# Patient Record
Sex: Male | Born: 1985 | Race: White | Hispanic: No | Marital: Single | Smoking: Current every day smoker
Health system: Southern US, Community
[De-identification: ages and names within clinical notes are randomized; demographics above are authoritative.]

## PROBLEM LIST (undated history)

## (undated) DIAGNOSIS — B1920 Unspecified viral hepatitis C without hepatic coma: Secondary | ICD-10-CM

## (undated) DIAGNOSIS — F1910 Other psychoactive substance abuse, uncomplicated: Secondary | ICD-10-CM

---

## 2006-03-18 ENCOUNTER — Emergency Department: Payer: Self-pay | Admitting: Emergency Medicine

## 2008-07-13 ENCOUNTER — Emergency Department: Payer: Self-pay | Admitting: Emergency Medicine

## 2009-12-02 ENCOUNTER — Emergency Department: Payer: Self-pay | Admitting: Unknown Physician Specialty

## 2010-02-21 ENCOUNTER — Emergency Department: Payer: Self-pay | Admitting: Unknown Physician Specialty

## 2010-05-05 ENCOUNTER — Emergency Department: Payer: Self-pay | Admitting: Emergency Medicine

## 2010-05-12 ENCOUNTER — Emergency Department: Payer: Self-pay | Admitting: Emergency Medicine

## 2010-05-18 ENCOUNTER — Emergency Department: Payer: Self-pay | Admitting: Internal Medicine

## 2010-07-31 ENCOUNTER — Emergency Department: Payer: Self-pay | Admitting: Unknown Physician Specialty

## 2010-08-03 ENCOUNTER — Emergency Department: Payer: Self-pay | Admitting: Emergency Medicine

## 2011-03-12 ENCOUNTER — Emergency Department: Payer: Self-pay | Admitting: Emergency Medicine

## 2011-03-21 ENCOUNTER — Emergency Department: Payer: Self-pay | Admitting: Emergency Medicine

## 2011-04-17 ENCOUNTER — Emergency Department: Payer: Self-pay | Admitting: Internal Medicine

## 2011-05-27 ENCOUNTER — Emergency Department: Payer: Self-pay | Admitting: Unknown Physician Specialty

## 2011-09-06 ENCOUNTER — Emergency Department: Payer: Self-pay | Admitting: Emergency Medicine

## 2011-12-06 ENCOUNTER — Emergency Department: Payer: Self-pay | Admitting: Emergency Medicine

## 2011-12-06 LAB — URINALYSIS, COMPLETE
Bacteria: NONE SEEN
Glucose,UR: NEGATIVE mg/dL (ref 0–75)
Nitrite: NEGATIVE
Ph: 7 (ref 4.5–8.0)
Protein: NEGATIVE
Specific Gravity: 1.001 (ref 1.003–1.030)
WBC UR: <1 /HPF (ref 0–5)

## 2011-12-06 LAB — CBC
HGB: 15.3 g/dL (ref 13.0–18.0)
MCH: 34.1 pg — ABNORMAL HIGH (ref 26.0–34.0)
MCHC: 34.6 g/dL (ref 32.0–36.0)
MCV: 99 fL (ref 80–100)
RBC: 4.49 10*6/uL (ref 4.40–5.90)
WBC: 6.1 10*3/uL (ref 3.8–10.6)

## 2011-12-06 LAB — DRUG SCREEN, URINE
Amphetamines, Ur Screen: NEGATIVE (ref ?–1000)
Barbiturates, Ur Screen: NEGATIVE (ref ?–200)
Cannabinoid 50 Ng, Ur ~~LOC~~: NEGATIVE (ref ?–50)
Methadone, Ur Screen: NEGATIVE (ref ?–300)
Tricyclic, Ur Screen: NEGATIVE (ref ?–1000)

## 2011-12-06 LAB — COMPREHENSIVE METABOLIC PANEL
Anion Gap: 11 (ref 7–16)
Bilirubin,Total: 0.6 mg/dL (ref 0.2–1.0)
Chloride: 108 mmol/L — ABNORMAL HIGH (ref 98–107)
Co2: 25 mmol/L (ref 21–32)
Creatinine: 0.78 mg/dL (ref 0.60–1.30)
EGFR (African American): >60
EGFR (Non-African Amer.): >60
Potassium: 3.5 mmol/L (ref 3.5–5.1)
SGOT(AST): 41 U/L — ABNORMAL HIGH (ref 15–37)
Sodium: 144 mmol/L (ref 136–145)

## 2011-12-06 LAB — TSH: Thyroid Stimulating Horm: 0.63 u[IU]/mL

## 2014-02-14 ENCOUNTER — Emergency Department: Payer: Self-pay | Admitting: Emergency Medicine

## 2014-02-14 LAB — DRUG SCREEN, URINE
AMPHETAMINES, UR SCREEN: POSITIVE (ref ?–1000)
Barbiturates, Ur Screen: NEGATIVE (ref ?–200)
Benzodiazepine, Ur Scrn: POSITIVE (ref ?–200)
CANNABINOID 50 NG, UR ~~LOC~~: POSITIVE (ref ?–50)
COCAINE METABOLITE, UR ~~LOC~~: POSITIVE (ref ?–300)
MDMA (Ecstasy)Ur Screen: NEGATIVE (ref ?–500)
METHADONE, UR SCREEN: NEGATIVE (ref ?–300)
OPIATE, UR SCREEN: NEGATIVE (ref ?–300)
Phencyclidine (PCP) Ur S: NEGATIVE (ref ?–25)
TRICYCLIC, UR SCREEN: NEGATIVE (ref ?–1000)

## 2014-02-14 LAB — COMPREHENSIVE METABOLIC PANEL
ALBUMIN: 3.8 g/dL (ref 3.4–5.0)
Alkaline Phosphatase: 93 U/L
Anion Gap: 9 (ref 7–16)
BUN: 9 mg/dL (ref 7–18)
Bilirubin,Total: 0.5 mg/dL (ref 0.2–1.0)
Calcium, Total: 8.6 mg/dL (ref 8.5–10.1)
Chloride: 108 mmol/L — ABNORMAL HIGH (ref 98–107)
Co2: 23 mmol/L (ref 21–32)
Creatinine: 0.78 mg/dL (ref 0.60–1.30)
EGFR (African American): >60
Glucose: 101 mg/dL — ABNORMAL HIGH (ref 65–99)
OSMOLALITY: 278 (ref 275–301)
Potassium: 3.7 mmol/L (ref 3.5–5.1)
SGOT(AST): 34 U/L (ref 15–37)
SGPT (ALT): 29 U/L (ref 12–78)
SODIUM: 140 mmol/L (ref 136–145)
Total Protein: 8.2 g/dL (ref 6.4–8.2)

## 2014-02-14 LAB — CBC
HCT: 46.4 % (ref 40.0–52.0)
HGB: 16.2 g/dL (ref 13.0–18.0)
MCH: 34.5 pg — ABNORMAL HIGH (ref 26.0–34.0)
MCHC: 34.9 g/dL (ref 32.0–36.0)
MCV: 99 fL (ref 80–100)
PLATELETS: 240 10*3/uL (ref 150–440)
RBC: 4.69 10*6/uL (ref 4.40–5.90)
RDW: 12.5 % (ref 11.5–14.5)
WBC: 10.1 10*3/uL (ref 3.8–10.6)

## 2014-02-14 LAB — SALICYLATE LEVEL: Salicylates, Serum: 3.3 mg/dL — ABNORMAL HIGH

## 2014-02-14 LAB — ETHANOL
ETHANOL LVL: 177 mg/dL
Ethanol %: 0.177 % — ABNORMAL HIGH (ref 0.000–0.080)

## 2014-02-14 LAB — ACETAMINOPHEN LEVEL

## 2014-02-14 LAB — TSH: Thyroid Stimulating Horm: 0.518 u[IU]/mL

## 2014-11-01 ENCOUNTER — Emergency Department: Payer: Self-pay | Admitting: Emergency Medicine

## 2014-11-01 LAB — DRUG SCREEN, URINE
Amphetamines, Ur Screen: POSITIVE (ref ?–1000)
BENZODIAZEPINE, UR SCRN: POSITIVE (ref ?–200)
Barbiturates, Ur Screen: NEGATIVE (ref ?–200)
Cannabinoid 50 Ng, Ur ~~LOC~~: NEGATIVE (ref ?–50)
Cocaine Metabolite,Ur ~~LOC~~: POSITIVE (ref ?–300)
MDMA (ECSTASY) UR SCREEN: NEGATIVE (ref ?–500)
Methadone, Ur Screen: NEGATIVE (ref ?–300)
Opiate, Ur Screen: NEGATIVE (ref ?–300)
Phencyclidine (PCP) Ur S: NEGATIVE (ref ?–25)
Tricyclic, Ur Screen: NEGATIVE (ref ?–1000)

## 2014-11-01 LAB — URINALYSIS, COMPLETE
BACTERIA: NONE SEEN
BILIRUBIN, UR: NEGATIVE
BLOOD: NEGATIVE
GLUCOSE, UR: NEGATIVE mg/dL (ref 0–75)
KETONE: NEGATIVE
Leukocyte Esterase: NEGATIVE
Nitrite: NEGATIVE
Ph: 6 (ref 4.5–8.0)
Protein: NEGATIVE
RBC,UR: NONE SEEN /HPF (ref 0–5)
SPECIFIC GRAVITY: 1.003 (ref 1.003–1.030)
Squamous Epithelial: NONE SEEN
WBC UR: <1 /HPF (ref 0–5)

## 2014-11-01 LAB — COMPREHENSIVE METABOLIC PANEL
ALBUMIN: 3.6 g/dL (ref 3.4–5.0)
ALT: 22 U/L
Alkaline Phosphatase: 92 U/L
Anion Gap: 7 (ref 7–16)
BUN: 5 mg/dL — ABNORMAL LOW (ref 7–18)
Bilirubin,Total: 0.5 mg/dL (ref 0.2–1.0)
CALCIUM: 8.5 mg/dL (ref 8.5–10.1)
Chloride: 106 mmol/L (ref 98–107)
Co2: 28 mmol/L (ref 21–32)
Creatinine: 0.92 mg/dL (ref 0.60–1.30)
EGFR (Non-African Amer.): >60
GLUCOSE: 82 mg/dL (ref 65–99)
Osmolality: 278 (ref 275–301)
Potassium: 3.5 mmol/L (ref 3.5–5.1)
SGOT(AST): 36 U/L (ref 15–37)
Sodium: 141 mmol/L (ref 136–145)
TOTAL PROTEIN: 8.4 g/dL — AB (ref 6.4–8.2)

## 2014-11-01 LAB — CBC
HCT: 51 % (ref 40.0–52.0)
HGB: 17.4 g/dL (ref 13.0–18.0)
MCH: 33.5 pg (ref 26.0–34.0)
MCHC: 34.1 g/dL (ref 32.0–36.0)
MCV: 98 fL (ref 80–100)
Platelet: 173 10*3/uL (ref 150–440)
RBC: 5.18 10*6/uL (ref 4.40–5.90)
RDW: 12.7 % (ref 11.5–14.5)
WBC: 7.2 10*3/uL (ref 3.8–10.6)

## 2014-11-01 LAB — ETHANOL
Ethanol: 217 mg/dL
Ethanol: <3 mg/dL

## 2014-11-01 LAB — ACETAMINOPHEN LEVEL

## 2014-11-01 LAB — SALICYLATE LEVEL: Salicylates, Serum: 2.7 mg/dL

## 2015-05-15 ENCOUNTER — Emergency Department: Payer: Self-pay

## 2015-05-15 ENCOUNTER — Emergency Department
Admission: EM | Admit: 2015-05-15 | Discharge: 2015-05-15 | Disposition: A | Discharge status: Home / Self Care | Payer: Self-pay | Attending: Emergency Medicine | Admitting: Emergency Medicine

## 2015-05-15 ENCOUNTER — Encounter: Payer: Self-pay | Admitting: Emergency Medicine

## 2015-05-15 DIAGNOSIS — F1410 Cocaine abuse, uncomplicated: Secondary | ICD-10-CM | POA: Insufficient documentation

## 2015-05-15 DIAGNOSIS — F1510 Other stimulant abuse, uncomplicated: Secondary | ICD-10-CM | POA: Insufficient documentation

## 2015-05-15 DIAGNOSIS — F1210 Cannabis abuse, uncomplicated: Secondary | ICD-10-CM | POA: Insufficient documentation

## 2015-05-15 DIAGNOSIS — F10229 Alcohol dependence with intoxication, unspecified: Secondary | ICD-10-CM | POA: Insufficient documentation

## 2015-05-15 DIAGNOSIS — F10920 Alcohol use, unspecified with intoxication, uncomplicated: Secondary | ICD-10-CM

## 2015-05-15 DIAGNOSIS — Y9289 Other specified places as the place of occurrence of the external cause: Secondary | ICD-10-CM | POA: Insufficient documentation

## 2015-05-15 DIAGNOSIS — Y998 Other external cause status: Secondary | ICD-10-CM | POA: Insufficient documentation

## 2015-05-15 DIAGNOSIS — Y9389 Activity, other specified: Secondary | ICD-10-CM | POA: Insufficient documentation

## 2015-05-15 DIAGNOSIS — S0003XA Contusion of scalp, initial encounter: Secondary | ICD-10-CM | POA: Insufficient documentation

## 2015-05-15 HISTORY — DX: Other psychoactive substance abuse, uncomplicated: F19.10

## 2015-05-15 LAB — COMPREHENSIVE METABOLIC PANEL
ALK PHOS: 58 U/L (ref 38–126)
ALT: 46 U/L (ref 17–63)
AST: 38 U/L (ref 15–41)
Albumin: 3.8 g/dL (ref 3.5–5.0)
Anion gap: 11 (ref 5–15)
BUN: 7 mg/dL (ref 6–20)
CO2: 24 mmol/L (ref 22–32)
CREATININE: 0.82 mg/dL (ref 0.61–1.24)
Calcium: 8.4 mg/dL — ABNORMAL LOW (ref 8.9–10.3)
Chloride: 104 mmol/L (ref 101–111)
GFR calc Af Amer: >60 mL/min (ref >60)
GFR calc non Af Amer: >60 mL/min (ref >60)
Glucose, Bld: 78 mg/dL (ref 65–99)
Potassium: 3.9 mmol/L (ref 3.5–5.1)
Sodium: 139 mmol/L (ref 135–145)
Total Bilirubin: 0.5 mg/dL (ref 0.3–1.2)
Total Protein: 7 g/dL (ref 6.5–8.1)

## 2015-05-15 LAB — CBC WITH DIFFERENTIAL/PLATELET
Basophils Absolute: 0 10*3/uL (ref 0–0.1)
Basophils Relative: 0 %
EOS ABS: 0.2 10*3/uL (ref 0–0.7)
Eosinophils Relative: 3 %
HEMATOCRIT: 44.6 % (ref 40.0–52.0)
HEMOGLOBIN: 15.4 g/dL (ref 13.0–18.0)
LYMPHS ABS: 2.6 10*3/uL (ref 1.0–3.6)
Lymphocytes Relative: 40 %
MCH: 33.4 pg (ref 26.0–34.0)
MCHC: 34.6 g/dL (ref 32.0–36.0)
MCV: 96.6 fL (ref 80.0–100.0)
MONOS PCT: 9 %
Monocytes Absolute: 0.6 10*3/uL (ref 0.2–1.0)
Neutro Abs: 3.1 10*3/uL (ref 1.4–6.5)
Neutrophils Relative %: 48 %
Platelets: 247 10*3/uL (ref 150–440)
RBC: 4.62 MIL/uL (ref 4.40–5.90)
RDW: 12.5 % (ref 11.5–14.5)
WBC: 6.5 10*3/uL (ref 3.8–10.6)

## 2015-05-15 LAB — URINE DRUG SCREEN, QUALITATIVE (ARMC ONLY)
Amphetamines, Ur Screen: POSITIVE — AB
BARBITURATES, UR SCREEN: NOT DETECTED
Benzodiazepine, Ur Scrn: NOT DETECTED
CANNABINOID 50 NG, UR ~~LOC~~: POSITIVE — AB
COCAINE METABOLITE, UR ~~LOC~~: POSITIVE — AB
MDMA (ECSTASY) UR SCREEN: NOT DETECTED
Methadone Scn, Ur: NOT DETECTED
OPIATE, UR SCREEN: NOT DETECTED
Phencyclidine (PCP) Ur S: NOT DETECTED
TRICYCLIC, UR SCREEN: NOT DETECTED

## 2015-05-15 LAB — ACETAMINOPHEN LEVEL: Acetaminophen (Tylenol), Serum: <10 ug/mL — ABNORMAL LOW (ref 10–30)

## 2015-05-15 LAB — ETHANOL: Alcohol, Ethyl (B): 147 mg/dL — ABNORMAL HIGH (ref <5)

## 2015-05-15 MED ORDER — SODIUM CHLORIDE 0.9 % IV BOLUS (SEPSIS)
1000.0000 mL | Freq: Once | INTRAVENOUS | Status: AC
  Administered 2015-05-15: 1000 mL via INTRAVENOUS

## 2015-05-15 NOTE — ED Notes (Signed)
Pt attempted to obtain urine.  Was going to I&O cath but pt wants a few more minutes to try and go on own.
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
## 2015-05-15 NOTE — ED Notes (Signed)
Patients mother came. Patient discharged.

## 2015-05-15 NOTE — ED Notes (Signed)
Ice applied to head contusion
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
## 2015-05-15 NOTE — ED Notes (Signed)
Pt aware need for urine. Remains drowsy.
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
## 2015-06-16 ENCOUNTER — Encounter: Payer: Self-pay | Admitting: *Deleted

## 2015-06-16 ENCOUNTER — Emergency Department: Payer: Self-pay

## 2015-06-16 ENCOUNTER — Emergency Department
Admission: EM | Admit: 2015-06-16 | Discharge: 2015-06-16 | Disposition: A | Discharge status: Home / Self Care | Payer: Self-pay | Attending: Emergency Medicine | Admitting: Emergency Medicine

## 2015-06-16 DIAGNOSIS — Y9289 Other specified places as the place of occurrence of the external cause: Secondary | ICD-10-CM | POA: Insufficient documentation

## 2015-06-16 DIAGNOSIS — S50312A Abrasion of left elbow, initial encounter: Secondary | ICD-10-CM | POA: Insufficient documentation

## 2015-06-16 DIAGNOSIS — T1490XA Injury, unspecified, initial encounter: Secondary | ICD-10-CM

## 2015-06-16 DIAGNOSIS — F10120 Alcohol abuse with intoxication, uncomplicated: Secondary | ICD-10-CM | POA: Insufficient documentation

## 2015-06-16 DIAGNOSIS — W19XXXA Unspecified fall, initial encounter: Secondary | ICD-10-CM

## 2015-06-16 DIAGNOSIS — F10920 Alcohol use, unspecified with intoxication, uncomplicated: Secondary | ICD-10-CM

## 2015-06-16 DIAGNOSIS — Y998 Other external cause status: Secondary | ICD-10-CM | POA: Insufficient documentation

## 2015-06-16 DIAGNOSIS — Z720 Tobacco use: Secondary | ICD-10-CM | POA: Insufficient documentation

## 2015-06-16 DIAGNOSIS — S0219XA Other fracture of base of skull, initial encounter for closed fracture: Secondary | ICD-10-CM | POA: Insufficient documentation

## 2015-06-16 DIAGNOSIS — Y9389 Activity, other specified: Secondary | ICD-10-CM | POA: Insufficient documentation

## 2015-06-16 LAB — ETHANOL: Alcohol, Ethyl (B): 156 mg/dL — ABNORMAL HIGH (ref <5)

## 2015-06-16 MED ORDER — ACETAMINOPHEN 325 MG PO TABS
ORAL_TABLET | ORAL | Status: AC
  Administered 2015-06-16: 650 mg via ORAL
  Filled 2015-06-16: qty 2

## 2015-06-16 MED ORDER — ACETAMINOPHEN 325 MG PO TABS
650.0000 mg | ORAL_TABLET | Freq: Once | ORAL | Status: AC
  Administered 2015-06-16: 650 mg via ORAL
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
## 2015-06-16 NOTE — ED Notes (Signed)
Patient asking for pain medication will ask nurse Theodis Sato.

## 2015-06-16 NOTE — ED Notes (Signed)
Patient refused vital signs. States he wants to go. MD aware.
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
## 2015-06-16 NOTE — Discharge Instructions (Signed)
Alcohol Intoxication Alcohol intoxication occurs when you drink enough alcohol that it affects your ability to function. It can be mild or very severe. Drinking a lot of alcohol in a short time is called binge drinking. This can be very harmful. Drinking alcohol can also be more dangerous if you are taking medicines or other drugs. Some of the effects caused by alcohol may include:  Loss of coordination.  Changes in mood and behavior.  Unclear thinking.  Trouble talking (slurred speech).  Throwing up (vomiting).  Confusion.  Slowed breathing.  Twitching and shaking (seizures).  Loss of consciousness. HOME CARE  Do not drive after drinking alcohol.  Drink enough water and fluids to keep your pee (urine) clear or pale yellow. Avoid caffeine.  Only take medicine as told by your doctor. GET HELP IF:  You throw up (vomit) many times.  You do not feel better after a few days.  You frequently have alcohol intoxication. Your doctor can help decide if you should see a substance use treatment counselor. GET HELP RIGHT AWAY IF:  You become shaky when you stop drinking.  You have twitching and shaking.  You throw up blood. It may look bright red or like coffee grounds.  You notice blood in your poop (bowel movements).  You become lightheaded or pass out (faint). MAKE SURE YOU:   Understand these instructions.  Will watch your condition.  Will get help right away if you are not doing well or get worse. Document Released: 03/29/2008 Document Revised: 06/13/2013 Document Reviewed: 03/16/2013 [facility] Patient Information 2015 [place], [place]. This information is not intended to replace advice given to you by your health care provider. Make sure you discuss any questions you have with your health care provider.  Basilar Skull Fracture A basilar skull fracture means there is a break or crack in one of the bones that make up the bottom (base) of the skull. Usually, the pieces of  bone do not move out of place. The fracture is just a thin line that separates the bone. Basilar skull fractures often happen in the bones around the ears, nose, under the eyes, or near the upper spine. CAUSES  Most of the time, a basilar skull fracture is caused by a blow or forceful injury to the head. This could happen from:  A car crash.  Physical violence.  A fall from a high place. SYMPTOMS  Symptoms of a basilar skull fracture depend on where the fracture occurs. They also depend on what is near the fracture, such as blood vessels, nerves, and cerebrospinal fluid. Cerebrospinal fluid is the clear liquid that normally flows around the brain and spinal cord. Symptoms may include:  Clear liquid leaking or oozing from an ear or the nose.  Sudden loss of hearing after an injury.  Sudden loss of smell after an injury.  Blurred or double vision.  Trouble with balance or coordination.  Headache.  Nausea or vomiting.  Weakness or numbness in the face.  Bruises around the eyes.  Bruises behind the ear.  Blood leaking from the ear. DIAGNOSIS  Computed tomography (CT) is the best way to tell if you have a basilar skull fracture. Your caregiver may also:  Take a sample of the fluid leaking from your ear or nose. This fluid will then be tested in a lab.  Test your hearing.  Check the nerves in your face. TREATMENT  Most basilar skull fractures heal without treatment after several weeks. You may be given medicine for  headaches or nausea. Sometimes, surgery is needed in complicated cases. HOME CARE INSTRUCTIONS  Only take over-the-counter or prescription medicines for pain, fever, or discomfort as directed by your caregiver.  Have someone stay with you when you go home. This person will need to observe you closely for the next 48 hours.  Keep your head raised when you are lying down.  Rest. Avoid any activity that requires extra energy. Ask your caregiver when you can go  back to your normal activities.  Do not drive until your caregiver says it is okay.  Do not drink alcohol until your caregiver says it is okay.  Keep all follow-up appointments as directed by your caregiver. SEEK MEDICAL CARE IF: Your symptoms do not go away as expected. SEEK IMMEDIATE MEDICAL CARE IF:  Your symptoms get worse.  You, your family, or your friends notice you are developing new symptoms.  Your family or friends notice you are very drowsy or you are not acting normally.  You have a fever. MAKE SURE YOU:  Understand these instructions.  Will watch your condition.  Will get help right away if you are not doing well or get worse. Document Released: 09/30/2011 Document Revised: 01/03/2012 Document Reviewed: 09/30/2011 [facility] Patient Information 2015 [place], [place]. This information is not intended to replace advice given to you by your health care provider. Make sure you discuss any questions you have with your health care provider.

## 2015-06-16 NOTE — ED Notes (Signed)
Pt ambulated around unit with no difficulty

## 2015-06-16 NOTE — ED Notes (Signed)
MD at bedside. 
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
## 2015-06-16 NOTE — ED Notes (Signed)
Pt sleeping in bed, call bell in reach
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
## 2016-01-26 DIAGNOSIS — F19188 Other psychoactive substance abuse with other psychoactive substance-induced disorder: Secondary | ICD-10-CM | POA: Insufficient documentation

## 2016-01-26 DIAGNOSIS — F17210 Nicotine dependence, cigarettes, uncomplicated: Secondary | ICD-10-CM | POA: Insufficient documentation

## 2016-01-26 DIAGNOSIS — F1499 Cocaine use, unspecified with unspecified cocaine-induced disorder: Secondary | ICD-10-CM | POA: Insufficient documentation

## 2016-01-26 NOTE — ED Notes (Signed)
Pt returns to registration desk after hanging up phone, now stating he would like to see behavioral health because he is "hearing voices"

## 2016-01-26 NOTE — ED Notes (Addendum)
Pt to STAT registration desk wanting to be sent to [facility] to detox from drugs; pt denies any c/o, SI or HI; [facility] but our provider would be willing to see him for any medical concerns and pt offered info sheet for resources; pt cursing and refuses to take; pt then goes to telephone and speaking with someone of phone regarding such; pt cont to curse and berate [facility]

## 2016-01-27 ENCOUNTER — Encounter: Payer: Self-pay | Admitting: Emergency Medicine

## 2016-01-27 ENCOUNTER — Emergency Department
Admission: EM | Admit: 2016-01-27 | Discharge: 2016-01-27 | Payer: Self-pay | Attending: Emergency Medicine | Admitting: Emergency Medicine

## 2016-01-27 DIAGNOSIS — F1910 Other psychoactive substance abuse, uncomplicated: Secondary | ICD-10-CM

## 2016-01-27 LAB — URINE DRUG SCREEN, QUALITATIVE (ARMC ONLY)
AMPHETAMINES, UR SCREEN: NOT DETECTED
BARBITURATES, UR SCREEN: NOT DETECTED
BENZODIAZEPINE, UR SCRN: POSITIVE — AB
Cannabinoid 50 Ng, Ur ~~LOC~~: POSITIVE — AB
Cocaine Metabolite,Ur ~~LOC~~: POSITIVE — AB
MDMA (Ecstasy)Ur Screen: NOT DETECTED
METHADONE SCREEN, URINE: NOT DETECTED
OPIATE, UR SCREEN: POSITIVE — AB
Phencyclidine (PCP) Ur S: NOT DETECTED
Tricyclic, Ur Screen: NOT DETECTED

## 2016-01-27 LAB — ETHANOL: ALCOHOL ETHYL (B): 196 mg/dL — AB (ref <5)

## 2016-01-27 NOTE — ED Notes (Signed)
Pt presents to ED initially wanting detox from cocaine and xanax. Pt informed by first nurse that we no longer provide assistance with detox unless there is a medical need at this hospital, but pt was offered resources. Pt states he is tired of hanging out with drug addicts he needs some help. Pt also states that he has a psychiatric condition and has not seen psychiatrist about it yet. Pt states when he is coming down off his drugs and has been awake for six days sometimes he has visual hallucinations. Pt states when you have been awake for 6 days your body starts to see things. Pt states just sees things out of his peripheral vision and reports he does not see people chasing him or deamons trying to kill him or anything. Pt states he is also paranoid about things such as people he doesn't know putting needles in his arm. Denies SI or HI.

## 2016-01-27 NOTE — ED Provider Notes (Signed)
[facility] Emergency Department Provider Note  ____________________________________________  Time seen: Approximately 1:23 AM  I have reviewed the triage vital signs and the nursing notes.   HISTORY  Chief Complaint Psychiatric Evaluation    HPI Bradley Bush is a 30 y.o. male patient came to the emergency department reportedly looking for detox from drugs.  He was belligerent and triage and after initially stating that he wanted detox and being told that we do not do medical clearance for RTS or do inpatient detox, he showed thereafter decided that he is hearing voices and would like to see behavioral health.  After about another 30 minutes past he clarified again that he wanted detox from Xanax and cocaine and was again reminded that we do not do this type of detox.  He was brought back to room and is now stating that he wants to be discharged if we cannot help him.  I saw him in the hallway and verified that he has no suicidal ideation or homicidal ideation.  He has been dealing with drugs for a long time (gradual onset) and he has no desire to harm himself or anyone else.  He has no medical symptoms at this time.  Sometimes he thinks that maybe he sees things that are not actually there but he is not sure.  His speech is pressured and he is speaking rapidly but he has no acute complaints and appears to have the capacity to make his own medical decisions.  He describes his drug addiction as severe but knows he needs to seek outpatient follow-up.   Past Medical History  Diagnosis Date  . Drug abuse     There are no active problems to display for this patient.   History reviewed. No pertinent past surgical history.  No current outpatient prescriptions on file.  Allergies Review of patient's allergies indicates no known allergies.  No family history on file.  Social History Social History  Substance Use Topics  . Smoking status: Current Every Day Smoker     Types: Cigarettes  . Smokeless tobacco: Never Used  . Alcohol Use: Yes    Review of Systems Constitutional: No fever/chills Eyes: No visual changes. ENT: No sore throat. Cardiovascular: Denies chest pain. Respiratory: Denies shortness of breath. Gastrointestinal: No abdominal pain.  No nausea, no vomiting.  No diarrhea.  No constipation. Genitourinary: Negative for dysuria. Musculoskeletal: Negative for back pain. Skin: Negative for rash. Neurological: Negative for headaches, focal weakness or numbness.  10-point ROS otherwise negative.  ____________________________________________   PHYSICAL EXAM:  VITAL SIGNS: ED Triage Vitals  Enc Vitals Group     BP 01/27/16 0013 137/90 mmHg     Pulse Rate 01/27/16 0013 103     Resp 01/27/16 0013 20     Temp 01/27/16 0013 97.8 F (36.6 C)     Temp Source 01/27/16 0013 Oral     SpO2 01/27/16 0013 97 %     Weight 01/27/16 0013 145 lb (65.772 kg)     Height 01/27/16 0013  (1.956 m)     Head Cir --      Peak Flow --      Pain Score 01/27/16 0014 0     Pain Loc --      Pain Edu? --      Excl. in GC? --     Constitutional: Alert and oriented. Well appearing and in no acute distress. Eyes: Conjunctivae are normal. PERRL. EOMI. Head: Atraumatic. Nose: No congestion/rhinnorhea. Mouth/Throat:  Mucous membranes are moist.  Oropharynx non-erythematous. Neck: No stridor.  No meningeal signs.   Cardiovascular: Normal rate, regular rhythm. Good peripheral circulation. Grossly normal heart sounds.   Respiratory: Normal respiratory effort.  No retractions. Lungs CTAB. Gastrointestinal: Soft and nontender. No distention.  Musculoskeletal: No lower extremity tenderness nor edema. No gross deformities of extremities. Neurologic:  Normal speech and language. No gross focal neurologic deficits are appreciated.  Skin:  Skin is warm, dry and intact. No rash noted. Psychiatric: Mood and affect are pressured.  Denies SI/HI.  Possibly has  visual hallucinations  ____________________________________________   LABS (all labs ordered are listed, but only abnormal results are displayed)  Labs Reviewed  ETHANOL - Abnormal; Notable for the following:    Alcohol, Ethyl (B) 196 (*)    All other components within normal limits  URINE DRUG SCREEN, QUALITATIVE ([facility] ONLY) - Abnormal; Notable for the following:    Cocaine Metabolite,Ur [place] POSITIVE (*)    Opiate, Ur Screen POSITIVE (*)    Cannabinoid 50 Ng, Ur [place] POSITIVE (*)    Benzodiazepine, Ur Scrn POSITIVE (*)    All other components within normal limits   ____________________________________________  EKG  None ____________________________________________  RADIOLOGY   No results found.  ____________________________________________   PROCEDURES  Procedure(s) performed: None  Critical Care performed: No ____________________________________________   INITIAL IMPRESSION / ASSESSMENT AND PLAN / ED COURSE  Pertinent labs & imaging results that were available during my care of the patient were reviewed by me and considered in my medical decision making (see chart for details).  In spite of his substance abuse issues, I believe the patient has the capacity to make his own decisions at this time.  He has no emergent medical condition and he understands finally that we do not provide inpatient detox for cocaine and Xanax.  I am honoring his request for discharge and providing outpatient resources.  Of note the police took him into custody upon discharge from the hospital and are taking him to jail.  ____________________________________________  FINAL CLINICAL IMPRESSION(S) / ED DIAGNOSES  Final diagnoses:  Polysubstance abuse  Cocaine abuse Benzo abuse    NEW MEDICATIONS STARTED DURING THIS VISIT:  There are no discharge medications for this patient.     Note:  This document was prepared using Dragon voice recognition software and may include  unintentional dictation errors.    Loleta Roseory Deron Poole, MD 01/27/16 [phone]

## 2016-01-27 NOTE — Discharge Instructions (Signed)
You have been seen in the Emergency Department (ED) today for a psychiatric complaint and substance abuse.  We do not do detox in this hospital and encourage you to follow up with these community resources.    Another option is [facility] in [place].  Please return to the ED immediately if you have ANY thoughts of hurting yourself or anyone else, so that we may help you.  Please avoid alcohol and drug use.  Follow up with your doctor and/or therapist as soon as possible regarding today's ED visit.   Please follow up any other recommendations and clinic appointments provided by the psychiatry team that saw you in the Emergency Department.   Polysubstance Abuse When people abuse more than one drug or type of drug it is called polysubstance or polydrug abuse. For example, many smokers also drink alcohol. This is one form of polydrug abuse. Polydrug abuse also refers to the use of a drug to counteract an unpleasant effect produced by another drug. It may also be used to help with withdrawal from another drug. People who take stimulants may become agitated. Sometimes this agitation is countered with a tranquilizer. This helps protect against the unpleasant side effects. Polydrug abuse also refers to the use of different drugs at the same time.  Anytime drug use is interfering with normal living activities, it has become abuse. This includes problems with family and friends. Psychological dependence has developed when your mind tells you that the drug is needed. This is usually followed by physical dependence which has developed when continuing increases of drug are required to get the same feeling or "high". This is known as addiction or chemical dependency. A person's risk is much higher if there is a history of chemical dependency in the family. SIGNS OF CHEMICAL DEPENDENCY  You have been told by friends or family that drugs have become a problem.  You fight when using drugs.  You are having  blackouts (not remembering what you do while using).  You feel sick from using drugs but continue using.  You lie about use or amounts of drugs (chemicals) used.  You need chemicals to get you going.  You are suffering in work performance or in school because of drug use.  You get sick from use of drugs but continue to use anyway.  You need drugs to relate to people or feel comfortable in social situations.  You use drugs to forget problems. "Yes" answered to any of the above signs of chemical dependency indicates there are problems. The longer the use of drugs continues, the greater the problems will become. If there is a family history of drug or alcohol use, it is best not to experiment with these drugs. Continual use leads to tolerance. After tolerance develops more of the drug is needed to get the same feeling. This is followed by addiction. With addiction, drugs become the most important part of life. It becomes more important to take drugs than participate in the other usual activities of life. This includes relating to friends and family. Addiction is followed by dependency. Dependency is a condition where drugs are now needed not just to get high, but to feel normal. Addiction cannot be cured but it can be stopped. This often requires outside help and the care of professionals. Treatment centers are listed in the yellow pages under: Cocaine, Narcotics, and Alcoholics Anonymous. Most hospitals and clinics can refer you to a specialized care center. Talk to your caregiver if you need  help.   This information is not intended to replace advice given to you by your health care provider. Make sure you discuss any questions you have with your health care provider.   Document Released: 06/02/2005 Document Revised: 01/03/2012 Document Reviewed: 10/16/2014 Elsevier Interactive Patient Education [employer]2016 Elsevier Inc.

## 2016-02-10 IMAGING — CT CT HEAD W/O CM
3 of 6 series · 16 of 30 positions shown, 17 images · non-contrast
Comparison: 06/16/2015 at [DATE] a.m.

CLINICAL DATA: Fall, skull fracture. Re-evaluate for possible
intracranial hemorrhage.

EXAM:
CT HEAD WITHOUT CONTRAST
TECHNIQUE: Contiguous axial images were obtained from the base of the skull
through the vertex without intravenous contrast.

[Series 2: head wo · axial · 0.42mm/px · z∈[+590,+634]mm · 2 of 27 slices shown, 3 images]
[im 9/27  brain]
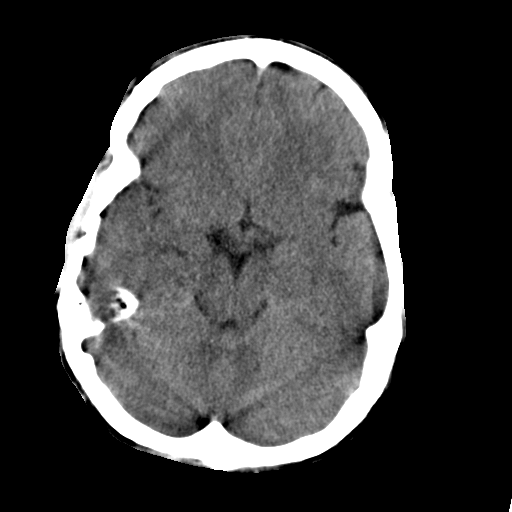
[im 9/27  bone]
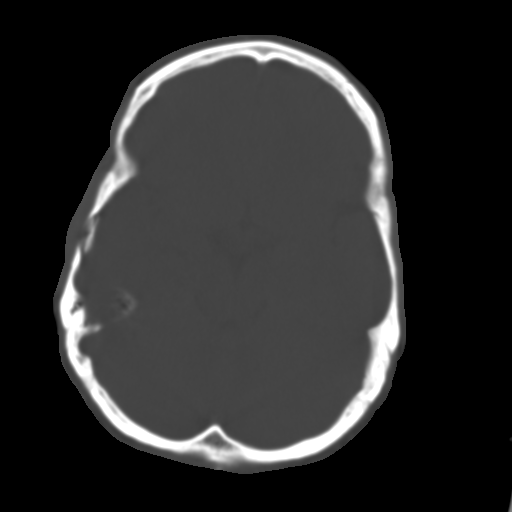
[im 18/27  brain]
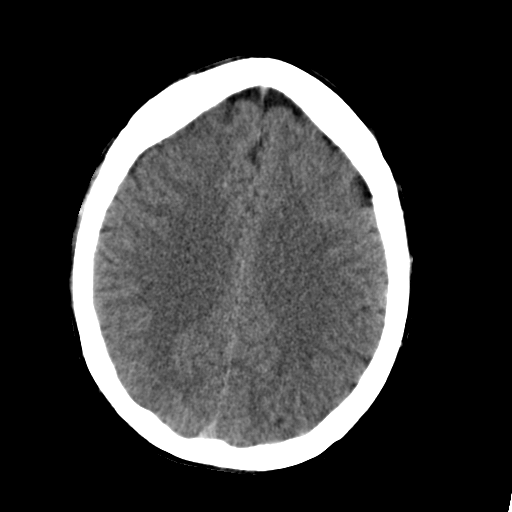

[Series 3: head bone · axial · 0.42mm/px · z∈[+557,+669]mm · 7 of 76 slices shown]
[im 10/76  bone]
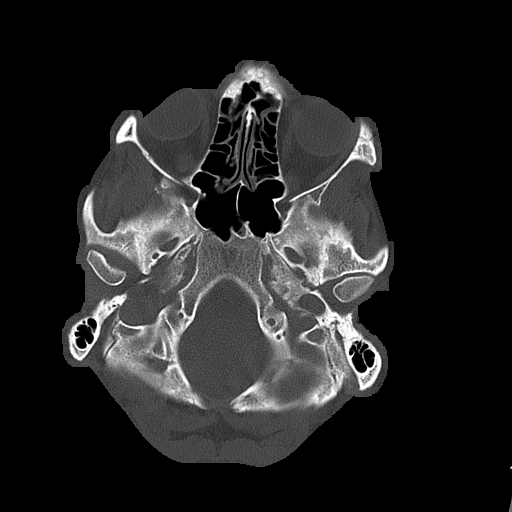
[im 19/76  bone]
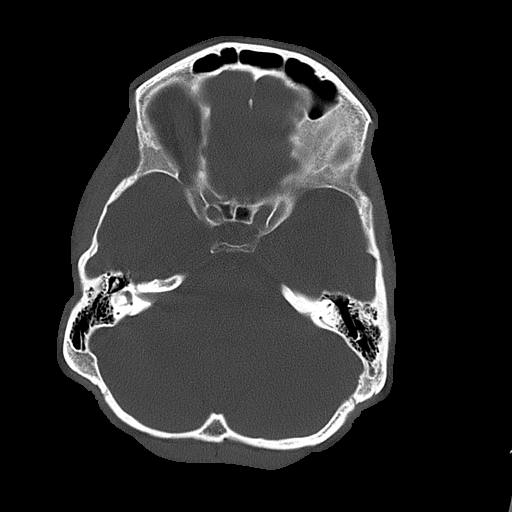
[im 29/76  bone]
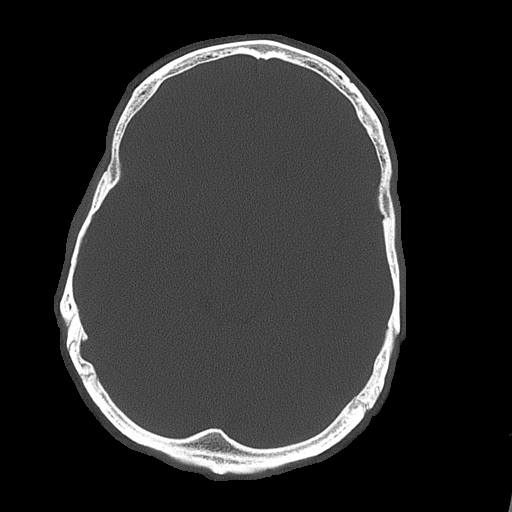
[im 38/76  bone]
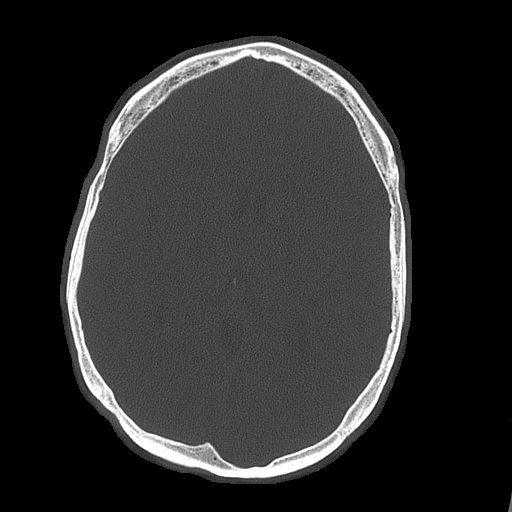
[im 47/76  bone]
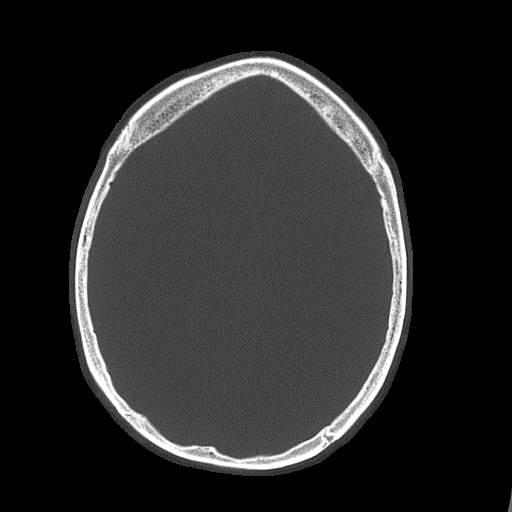
[im 57/76  bone]
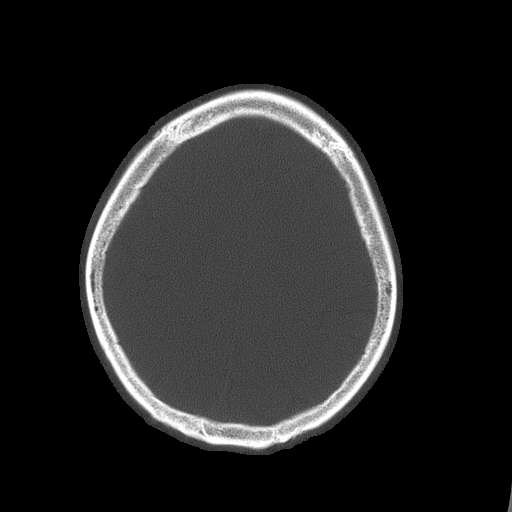
[im 66/76  bone]
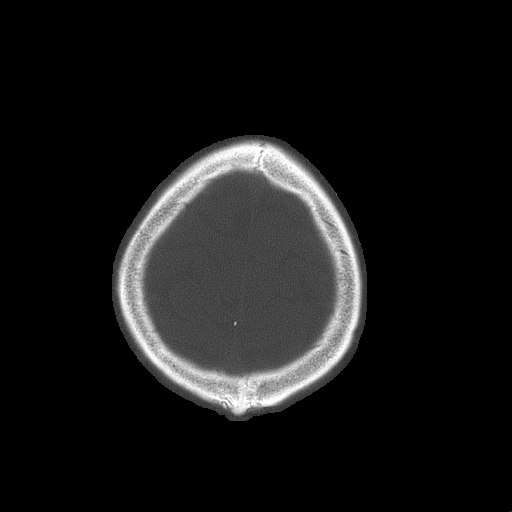

[Series 7: head bone-- · axial · 0.37mm/px · z∈[+571,+681]mm · 7 of 75 slices shown]
[im 10/75  bone]
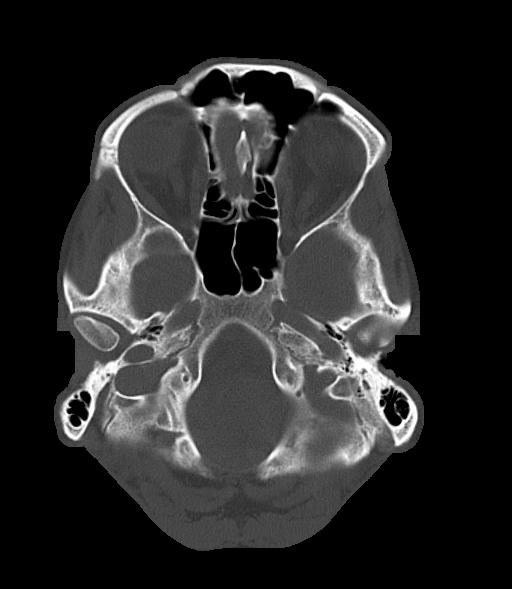
[im 19/75  bone]
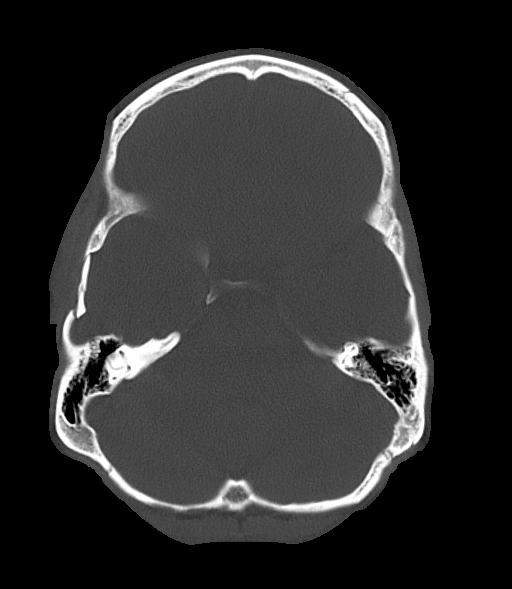
[im 28/75  bone]
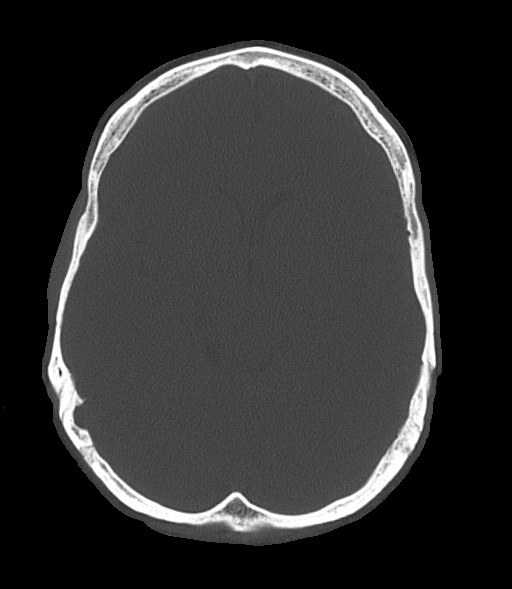
[im 38/75  bone]
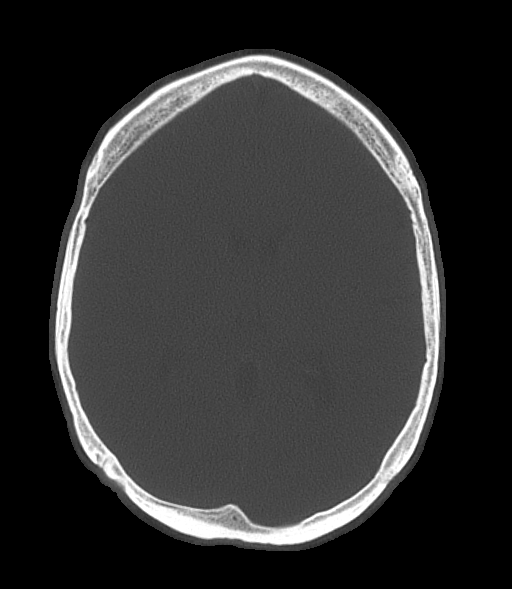
[im 47/75  bone]
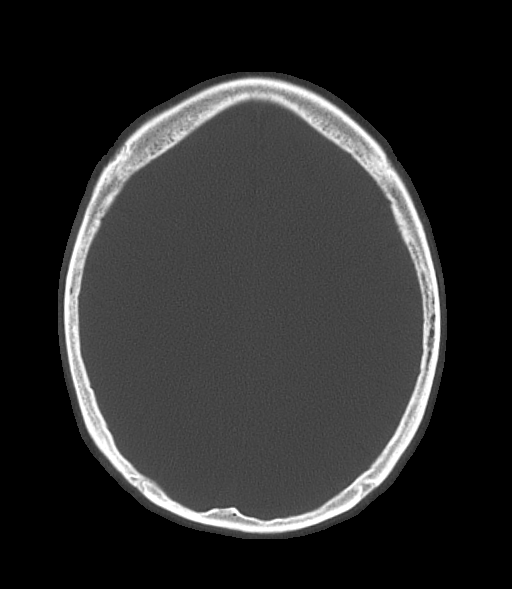
[im 56/75  bone]
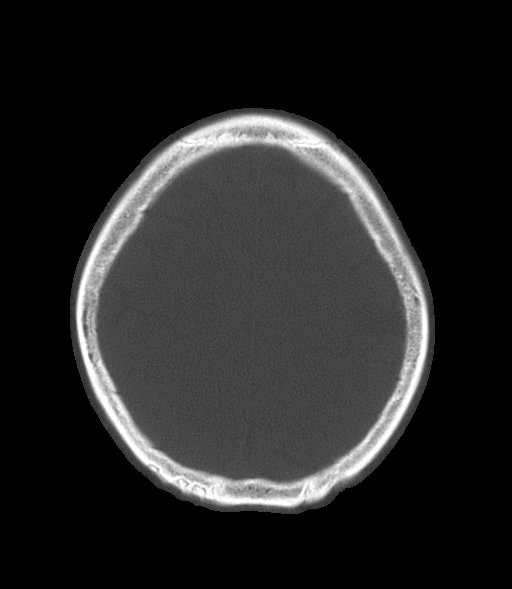
[im 65/75  bone]
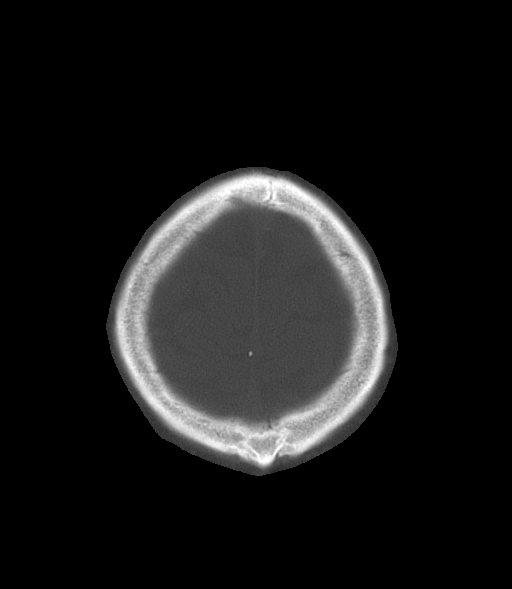

[16 of 30 positions shown; findings below may reference images not displayed]

FINDINGS: Mildly depressed comminuted right sphenoid wing fracture
reidentified with 5 mm displacement. Imaging of the skullbase is
again mildly degraded by motion. Right parietal scalp hematoma
reidentified, image 60 series 3. Orbits and paranasal sinuses are
grossly unremarkable. No acute hemorrhage, infarct, or mass lesion
is identified.
IMPRESSION: Right sphenoid skull fracture reidentified as well as right parietal
scalp hematoma but no new acute intracranial abnormality is
identified allowing for persistent mild motion artifact at the
skullbase.

## 2016-12-25 ENCOUNTER — Encounter: Payer: Self-pay | Admitting: Emergency Medicine

## 2016-12-25 ENCOUNTER — Emergency Department
Admission: EM | Admit: 2016-12-25 | Discharge: 2016-12-25 | Disposition: A | Discharge status: Home / Self Care | Payer: Self-pay | Attending: Emergency Medicine | Admitting: Emergency Medicine

## 2016-12-25 DIAGNOSIS — F1910 Other psychoactive substance abuse, uncomplicated: Secondary | ICD-10-CM | POA: Insufficient documentation

## 2016-12-25 DIAGNOSIS — F17210 Nicotine dependence, cigarettes, uncomplicated: Secondary | ICD-10-CM | POA: Insufficient documentation

## 2016-12-25 DIAGNOSIS — Z79899 Other long term (current) drug therapy: Secondary | ICD-10-CM | POA: Insufficient documentation

## 2016-12-25 LAB — URINE DRUG SCREEN, QUALITATIVE (ARMC ONLY)
AMPHETAMINES, UR SCREEN: POSITIVE — AB
Barbiturates, Ur Screen: NOT DETECTED
Benzodiazepine, Ur Scrn: POSITIVE — AB
COCAINE METABOLITE, UR ~~LOC~~: POSITIVE — AB
Cannabinoid 50 Ng, Ur ~~LOC~~: POSITIVE — AB
MDMA (ECSTASY) UR SCREEN: NOT DETECTED
METHADONE SCREEN, URINE: NOT DETECTED
Opiate, Ur Screen: NOT DETECTED
Phencyclidine (PCP) Ur S: NOT DETECTED
Tricyclic, Ur Screen: NOT DETECTED

## 2016-12-25 LAB — COMPREHENSIVE METABOLIC PANEL
ALBUMIN: 4.2 g/dL (ref 3.5–5.0)
ALK PHOS: 57 U/L (ref 38–126)
ALT: 26 U/L (ref 17–63)
ANION GAP: 5 (ref 5–15)
AST: 29 U/L (ref 15–41)
BUN: 12 mg/dL (ref 6–20)
CO2: 29 mmol/L (ref 22–32)
Calcium: 8.8 mg/dL — ABNORMAL LOW (ref 8.9–10.3)
Chloride: 103 mmol/L (ref 101–111)
Creatinine, Ser: 0.87 mg/dL (ref 0.61–1.24)
GFR calc Af Amer: >60 mL/min (ref >60)
GFR calc non Af Amer: >60 mL/min (ref >60)
GLUCOSE: 91 mg/dL (ref 65–99)
POTASSIUM: 3.7 mmol/L (ref 3.5–5.1)
SODIUM: 137 mmol/L (ref 135–145)
Total Bilirubin: 0.8 mg/dL (ref 0.3–1.2)
Total Protein: 7.6 g/dL (ref 6.5–8.1)

## 2016-12-25 LAB — CBC
HCT: 43.6 % (ref 40.0–52.0)
HEMOGLOBIN: 15.1 g/dL (ref 13.0–18.0)
MCH: 33.4 pg (ref 26.0–34.0)
MCHC: 34.6 g/dL (ref 32.0–36.0)
MCV: 96.7 fL (ref 80.0–100.0)
Platelets: 169 10*3/uL (ref 150–440)
RBC: 4.51 MIL/uL (ref 4.40–5.90)
RDW: 14 % (ref 11.5–14.5)
WBC: 5 10*3/uL (ref 3.8–10.6)

## 2016-12-25 LAB — SALICYLATE LEVEL: Salicylate Lvl: <7 mg/dL (ref 2.8–30.0)

## 2016-12-25 LAB — ACETAMINOPHEN LEVEL

## 2016-12-25 LAB — ETHANOL: Alcohol, Ethyl (B): <5 mg/dL (ref <5)

## 2016-12-25 NOTE — ED Notes (Signed)
Pt. Being discharged in police custody.

## 2016-12-25 NOTE — ED Triage Notes (Signed)
Reports took a klonopin 1mg  and swallowed some meth prior to arrival approximately 15 minutes.

## 2016-12-25 NOTE — ED Provider Notes (Signed)
[facility] Emergency Department Provider Note  ____________________________________________   First MD Initiated Contact with Patient 12/25/16 [phone]     (approximate)  I have reviewed the triage vital signs and the nursing notes.   HISTORY  Chief Complaint Ingestion    HPI Bradley Bush is a 31 y.o. male with a long history of polysubstance abuse and most specifically and most recently of meth who presents in the custody of police for processing multiple drugs and for evaluation of swallowing some methamphetamine so that he would not be caught with it.  He reports that the quantity he swallowed was attempted a gram, which he states "is nothing".  He reports that he will often inject or smoke 3 times that amount over the course of the day or 2.  He reports that he is "buzzing" but feels fine otherwise.  He denies nausea, vomiting, abdominal pain, difficulty breathing, fever/chills.   Past Medical History:  Diagnosis Date  . Drug abuse     There are no active problems to display for this patient.   History reviewed. No pertinent surgical history.  Prior to Admission medications   Medication Sig Start Date End Date Taking? Authorizing Provider  clonazePAM (KLONOPIN) 1 MG tablet Take 1 mg by mouth.   Yes Historical Provider, MD    Allergies Patient has no known allergies.  History reviewed. No pertinent family history.  Social History Social History  Substance Use Topics  . Smoking status: Current Every Day Smoker    Types: Cigarettes  . Smokeless tobacco: Never Used  . Alcohol use Yes    Review of Systems Constitutional: No fever/chills Eyes: No visual changes. ENT: No sore throat. Cardiovascular: Denies chest pain. Respiratory: Denies shortness of breath. Gastrointestinal: No abdominal pain.  No nausea, no vomiting.  No diarrhea.  No constipation. Genitourinary: Negative for dysuria. Musculoskeletal: Negative for back pain. Skin:  Negative for rash. Neurological: Negative for headaches, focal weakness or numbness.  10-point ROS otherwise negative.  ____________________________________________   PHYSICAL EXAM:  VITAL SIGNS: ED Triage Vitals  Enc Vitals Group     BP 12/25/16 0434 (!) 142/98     Pulse Rate 12/25/16 0434 81     Resp 12/25/16 0434 18     Temp 12/25/16 0434 97.9 F (36.6 C)     Temp Source 12/25/16 0434 Oral     SpO2 12/25/16 0434 100 %     Weight 12/25/16 0408 150 lb (68 kg)     Height 12/25/16 0408 6\' 5"  (1.956 m)     Head Circumference --      Peak Flow --      Pain Score 12/25/16 0434 0     Pain Loc --      Pain Edu? --      Excl. in GC? --     Constitutional: Alert and oriented. Well appearing and in no acute distress. Eyes: Conjunctivae are normal. PERRL. EOMI. Head: Atraumatic. Nose: No congestion/rhinnorhea. Mouth/Throat: Mucous membranes are moist. Neck: No stridor.  No meningeal signs.   Cardiovascular: Normal rate, regular rhythm. Good peripheral circulation. Grossly normal heart sounds. Respiratory: Normal respiratory effort.  No retractions. Lungs CTAB. Gastrointestinal: Soft and nontender. No distention.  Musculoskeletal: No lower extremity tenderness nor edema. No gross deformities of extremities. Neurologic:  Normal speech and language. No gross focal neurologic deficits are appreciated.  Skin:  Skin is warm, dry and intact. No rash noted. Psychiatric: Mood and affect are agitated but essentially normal.  ____________________________________________  LABS (all labs ordered are listed, but only abnormal results are displayed)  Labs Reviewed  COMPREHENSIVE METABOLIC PANEL - Abnormal; Notable for the following:       Result Value   Calcium 8.8 (*)    All other components within normal limits  ACETAMINOPHEN LEVEL - Abnormal; Notable for the following:    Acetaminophen (Tylenol), Serum <10 (*)    All other components within normal limits  URINE DRUG SCREEN,  QUALITATIVE ([facility] ONLY) - Abnormal; Notable for the following:    Amphetamines, Ur Screen POSITIVE (*)    Cocaine Metabolite,Ur [place] POSITIVE (*)    Cannabinoid 50 Ng, Ur [place] POSITIVE (*)    Benzodiazepine, Ur Scrn POSITIVE (*)    All other components within normal limits  ETHANOL  SALICYLATE LEVEL  CBC  CBG MONITORING, ED   ____________________________________________  EKG  ED ECG REPORT I, Shaneika Rossa, the attending physician, personally viewed and interpreted this ECG.  Date: 12/25/2016 EKG Time: 04:12 Rate: 86 Rhythm: normal sinus rhythm QRS Axis: normal Intervals: normal ST/T Wave abnormalities: normal Conduction Disturbances: none Narrative Interpretation: unremarkable  ____________________________________________  RADIOLOGY   No results found.  ____________________________________________   PROCEDURES  Procedure(s) performed:   Procedures   Critical Care performed: No ____________________________________________   INITIAL IMPRESSION / ASSESSMENT AND PLAN / ED COURSE  Pertinent labs & imaging results that were available during my care of the patient were reviewed by me and considered in my medical decision making (see chart for details).  The patient has normal vital signs, a normal EKG, labs only notable for polysubstance abuse, and a reassuring physical exam.  There is no indication to keep the patient for any extended period of observation based on the relatively small amount that he ingested, particularly given how normal his workup is been.  I will discharge in the custody of the police to go to jail.      ____________________________________________  FINAL CLINICAL IMPRESSION(S) / ED DIAGNOSES  Final diagnoses:  Polysubstance abuse     MEDICATIONS GIVEN DURING THIS VISIT:  Medications - No data to display   NEW OUTPATIENT MEDICATIONS STARTED DURING THIS VISIT:  New Prescriptions   No medications on file    Modified Medications     No medications on file    Discontinued Medications   No medications on file     Note:  This document was prepared using Dragon voice recognition software and may include unintentional dictation errors.    Loleta Rose, MD 12/25/16 [phone]

## 2016-12-25 NOTE — Discharge Instructions (Signed)
You have been seen in the Emergency Department (ED) today for substance abuse.  You have been evaluated by the ED physician(s) and are being discharged with outpatient resources and follow up recommendations.  Please avoid alcohol and drug use.  Follow up with your doctor and/or therapist as soon as possible regarding today's ED  visit.   Please follow up any other recommendations and clinic appointments provided by the psychiatry team that saw you in the Emergency Department.

## 2016-12-25 NOTE — ED Notes (Signed)
Police officer in room with patient.  Pt. Was being arrested and swallowed some meth.  Pt. Resting on bed with officer at bed side.

## 2019-09-13 ENCOUNTER — Ambulatory Visit: Payer: Self-pay

## 2019-09-17 ENCOUNTER — Ambulatory Visit: Payer: Self-pay

## 2020-03-07 ENCOUNTER — Telehealth: Payer: Self-pay

## 2020-03-07 NOTE — Telephone Encounter (Signed)
Yes this is fine.

## 2020-03-07 NOTE — Telephone Encounter (Signed)
Copied from CRM [id number]. Topic: General - Inquiry >> Mar 07, 2020  9:51 AM Deborha Payment wrote: Reason for CRM: Patient is requesting a new patient appt with Joycelyn Man. Patient mother is Beverely Risen. Arline Asp is a patient of Dr.Burnette.  Call back [phone]

## 2020-04-11 ENCOUNTER — Ambulatory Visit: Payer: Self-pay | Admitting: Physician Assistant

## 2020-04-11 NOTE — Progress Notes (Deleted)
    New patient visit   Patient: Bradley Bush   DOB: 1985/12/27   34 y.o. Male  MRN: [id number] Visit Date: 04/11/2020  Today's healthcare provider: Margaretann Loveless, PA-C   No chief complaint on file.  Subjective    Bradley Bush is a 34 y.o. male who presents today as a new patient to establish care.  HPI  ***  Past Medical History:  Diagnosis Date  . Drug abuse    No past surgical history on file. No family status information on file.   No family history on file. Social History   Socioeconomic History  . Marital status: Single    Spouse name: Not on file  . Number of children: Not on file  . Years of education: Not on file  . Highest education level: Not on file  Occupational History  . Not on file  Tobacco Use  . Smoking status: Current Every Day Smoker    Types: Cigarettes  . Smokeless tobacco: Never Used  Substance and Sexual Activity  . Alcohol use: Yes  . Drug use: Yes    Types: Cocaine, IV  . Sexual activity: Yes  Other Topics Concern  . Not on file  Social History Narrative  . Not on file   Social Determinants of Health   Financial Resource Strain:   . Difficulty of Paying Living Expenses:   Food Insecurity:   . Worried About Programme researcher, broadcasting/film/video in the Last Year:   . Barista in the Last Year:   Transportation Needs:   . Freight forwarder (Medical):   Marland Kitchen Lack of Transportation (Non-Medical):   Physical Activity:   . Days of Exercise per Week:   . Minutes of Exercise per Session:   Stress:   . Feeling of Stress :   Social Connections:   . Frequency of Communication with Friends and Family:   . Frequency of Social Gatherings with Friends and Family:   . Attends Religious Services:   . Active Member of Clubs or Organizations:   . Attends Banker Meetings:   Marland Kitchen Marital Status:    Outpatient Medications Prior to Visit  Medication Sig  . clonazePAM (KLONOPIN) 1 MG tablet Take 1 mg by mouth.   No  facility-administered medications prior to visit.   No Known Allergies   There is no immunization history on file for this patient.  Health Maintenance  Topic Date Due  . Hepatitis C Screening  Never done  . COVID-19 Vaccine (1) Never done  . HIV Screening  Never done  . TETANUS/TDAP  Never done  . INFLUENZA VACCINE  05/25/2020    Patient Care Team: Patient, No Pcp Per as PCP - General (General Practice)  Review of Systems  {Heme  Chem  Endocrine  Serology  Results Review (optional):[id number]::" "}  Objective    There were no vitals taken for this visit. Physical Exam ***  Depression Screen No flowsheet data found. No results found for any visits on 04/11/20.  Assessment & Plan     ***  No follow-ups on file.     {provider attestation***:1}   Reine Just  [facility] [phone] (phone) [phone] (fax)  [facility]

## 2020-08-01 ENCOUNTER — Emergency Department
Admission: EM | Admit: 2020-08-01 | Discharge: 2020-08-01 | Disposition: A | Discharge status: Home / Self Care | Payer: Self-pay | Attending: Emergency Medicine | Admitting: Emergency Medicine

## 2020-08-01 ENCOUNTER — Other Ambulatory Visit: Payer: Self-pay

## 2020-08-01 ENCOUNTER — Encounter: Payer: Self-pay | Admitting: Emergency Medicine

## 2020-08-01 DIAGNOSIS — F1910 Other psychoactive substance abuse, uncomplicated: Secondary | ICD-10-CM | POA: Insufficient documentation

## 2020-08-01 DIAGNOSIS — F419 Anxiety disorder, unspecified: Secondary | ICD-10-CM | POA: Insufficient documentation

## 2020-08-01 DIAGNOSIS — F32A Depression, unspecified: Secondary | ICD-10-CM

## 2020-08-01 DIAGNOSIS — F17210 Nicotine dependence, cigarettes, uncomplicated: Secondary | ICD-10-CM | POA: Insufficient documentation

## 2020-08-01 DIAGNOSIS — F329 Major depressive disorder, single episode, unspecified: Secondary | ICD-10-CM | POA: Insufficient documentation

## 2020-08-01 DIAGNOSIS — Z20822 Contact with and (suspected) exposure to covid-19: Secondary | ICD-10-CM | POA: Insufficient documentation

## 2020-08-01 HISTORY — DX: Unspecified viral hepatitis C without hepatic coma: B19.20

## 2020-08-01 LAB — URINE DRUG SCREEN, QUALITATIVE (ARMC ONLY)
Amphetamines, Ur Screen: POSITIVE — AB
Barbiturates, Ur Screen: NOT DETECTED
Benzodiazepine, Ur Scrn: NOT DETECTED
Cannabinoid 50 Ng, Ur ~~LOC~~: POSITIVE — AB
Cocaine Metabolite,Ur ~~LOC~~: NOT DETECTED
MDMA (Ecstasy)Ur Screen: NOT DETECTED
Methadone Scn, Ur: NOT DETECTED
Opiate, Ur Screen: NOT DETECTED
Phencyclidine (PCP) Ur S: NOT DETECTED
Tricyclic, Ur Screen: NOT DETECTED

## 2020-08-01 LAB — CBC
HCT: 44.4 % (ref 39.0–52.0)
Hemoglobin: 16.2 g/dL (ref 13.0–17.0)
MCH: 32.3 pg (ref 26.0–34.0)
MCHC: 36.5 g/dL — ABNORMAL HIGH (ref 30.0–36.0)
MCV: 88.6 fL (ref 80.0–100.0)
Platelets: 216 10*3/uL (ref 150–400)
RBC: 5.01 MIL/uL (ref 4.22–5.81)
RDW: 12.1 % (ref 11.5–15.5)
WBC: 5.2 10*3/uL (ref 4.0–10.5)
nRBC: 0 % (ref 0.0–0.2)

## 2020-08-01 LAB — COMPREHENSIVE METABOLIC PANEL
ALT: 42 U/L (ref 0–44)
AST: 35 U/L (ref 15–41)
Albumin: 4.5 g/dL (ref 3.5–5.0)
Alkaline Phosphatase: 39 U/L (ref 38–126)
Anion gap: 10 (ref 5–15)
BUN: 17 mg/dL (ref 6–20)
CO2: 26 mmol/L (ref 22–32)
Calcium: 9.2 mg/dL (ref 8.9–10.3)
Chloride: 101 mmol/L (ref 98–111)
Creatinine, Ser: 0.78 mg/dL (ref 0.61–1.24)
GFR calc non Af Amer: >60 mL/min (ref >60)
Glucose, Bld: 97 mg/dL (ref 70–99)
Potassium: 3.3 mmol/L — ABNORMAL LOW (ref 3.5–5.1)
Sodium: 137 mmol/L (ref 135–145)
Total Bilirubin: 1.4 mg/dL — ABNORMAL HIGH (ref 0.3–1.2)
Total Protein: 7.7 g/dL (ref 6.5–8.1)

## 2020-08-01 LAB — RESPIRATORY PANEL BY RT PCR (FLU A&B, COVID)
Influenza A by PCR: NEGATIVE
Influenza B by PCR: NEGATIVE
SARS Coronavirus 2 by RT PCR: NEGATIVE

## 2020-08-01 LAB — SALICYLATE LEVEL: Salicylate Lvl: <7 mg/dL — ABNORMAL LOW (ref 7.0–30.0)

## 2020-08-01 LAB — ACETAMINOPHEN LEVEL: Acetaminophen (Tylenol), Serum: <10 ug/mL — ABNORMAL LOW (ref 10–30)

## 2020-08-01 LAB — ETHANOL: Alcohol, Ethyl (B): <10 mg/dL (ref <10)

## 2020-08-01 MED ORDER — TRIHEXYPHENIDYL HCL 2 MG PO TABS
1.0000 mg | ORAL_TABLET | Freq: Two times a day (BID) | ORAL | 0 refills | Status: DC
Start: 2020-08-01 — End: 2022-11-24

## 2020-08-01 MED ORDER — RISPERIDONE 1 MG PO TABS
0.5000 mg | ORAL_TABLET | Freq: Two times a day (BID) | ORAL | Status: DC
  Administered 2020-08-01: 0.5 mg via ORAL
  Filled 2020-08-01: qty 1

## 2020-08-01 MED ORDER — TRIHEXYPHENIDYL HCL 2 MG PO TABS
1.0000 mg | ORAL_TABLET | Freq: Two times a day (BID) | ORAL | Status: DC
  Administered 2020-08-01: 1 mg via ORAL
  Filled 2020-08-01: qty 1

## 2020-08-01 MED ORDER — DULOXETINE HCL 20 MG PO CPEP: 20.0000 mg | ORAL_CAPSULE | Freq: Every day | ORAL | 0 refills | Status: DC

## 2020-08-01 MED ORDER — RISPERIDONE 0.5 MG PO TABS: 0.5000 mg | ORAL_TABLET | Freq: Two times a day (BID) | ORAL | 0 refills | Status: DC

## 2020-08-01 MED ORDER — DULOXETINE HCL 20 MG PO CPEP
20.0000 mg | ORAL_CAPSULE | Freq: Every day | ORAL | Status: DC
  Administered 2020-08-01: 20 mg via ORAL
  Filled 2020-08-01: qty 1

## 2020-08-01 NOTE — ED Notes (Signed)
Pt discharge home. VS stable. Discharge instructions and prescriptions reviewed with patient.  All belongings returned to pt. Pt denies SI.

## 2020-08-01 NOTE — Consult Note (Signed)
[facility] Face-to-Face Psychiatry Consult   Reason for Consult:    Voluntary Status Wants meds for depression and paranoia fear  Actively using Methamphetamine Marijuana    CC --I need -meds for my symptoms     Referring Physician:   ED MD  Patient Identification: Bradley Bush MRN:  [id number] Principal Diagnosis: <principal problem not specified> Diagnosis:  Active Problems:   * No active hospital problems. *  Major Depression with Psychosis IED  Generalized anxiety  Panic Disorder  Adjustment Disorder  Amphetamine dependence and withdrawal  Past cocaine use  Current Marijuana  Psychosis, Anxiety and depression related to Amphetamine use   Total Time spent with patient:   30-40 min  Interviewed --Mom as well.   Subjective:   Bradley Bush is a 34 y.o. male patient who is asking for  Help with above issues  He says with or without drugs he has   IED issues,--angry irritable edgy frustrated explosive --argues with Mom --and she cannot handle him ----at home. He has his own place and also has been at mom where she I s scared  She knows he is still on drugs    He also has severe panic, worry, nervousness tensioni, frustration ---sudden autonomic symptoms and somatic problems   He had dysthymia and major depression    He has psychosis with paranoia fear and feelings of people watching him of course made worse by drugs before and after buthad this growing up too.    History of ADHD combined and LD's     Works at [employer] and feels stressed   No active Si HI or plans --   Unclear if he has active desire to stop drugs but does seek actual meds and [facility] clinic visit   No recent AA NA related programming  No major groups or therapy recovery efforts   Court and Legal issues --post jail for various charges --no parole and probation    Family History --Mom with severe ADHD, ETOH dependence,  ----Her Dad with severe ETOH issues  Patient's brother with Antisocial  personality and drugs --------Patients' Dad with     HPI:  See above and below  Past Psychiatric History: see above and below   Risk to Self: Suicidal Ideation: No Suicidal Intent: No Is patient at risk for suicide?: No Suicidal Plan?: No Access to Means: No What has been your use of drugs/alcohol within the last 12 months?: Methamphetamine, suboxone, cannabis How many times?: 0 Other Self Harm Risks: Active substance use Triggers for Past Attempts: None known Intentional Self Injurious Behavior: None Risk to Others: Homicidal Ideation: No Thoughts of Harm to Others: No Current Homicidal Intent: No Current Homicidal Plan: No Access to Homicidal Means: No Identified Victim: Reports of none History of harm to others?: No Assessment of Violence: None Noted Violent Behavior Description: Reports of none Does patient have access to weapons?: No Criminal Charges Pending?: No Does patient have a court date: No Prior Inpatient Therapy: Prior Inpatient Therapy: Yes Prior Therapy Dates: 2019 Prior Therapy Facilty/Provider(s): [facility] Reason for Treatment: Substance Abuse Treatment Prior Outpatient Therapy: Prior Outpatient Therapy: No Does patient have an ACCT team?: No Does patient have Intensive In-House Services?  : No Does patient have [facility] services? : No Does patient have P4CC services?: No  Past Medical History:  Past Medical History:  Diagnosis Date  . Drug abuse (HCC)   . Hepatitis C    History reviewed. No pertinent surgical history. Family History: No family history on  file. Family Psychiatric  History:  See above and below   Social History:  Social History   Substance and Sexual Activity  Alcohol Use Yes     Social History   Substance and Sexual Activity  Drug Use Yes  . Types: Cocaine, IV    Social History   Socioeconomic History  . Marital status: Single    Spouse name: Not on file  . Number of children: Not on file  . Years of education: Not on  file  . Highest education level: Not on file  Occupational History  . Not on file  Tobacco Use  . Smoking status: Current Every Day Smoker    Types: Cigarettes  . Smokeless tobacco: Never Used  Substance and Sexual Activity  . Alcohol use: Yes  . Drug use: Yes    Types: Cocaine, IV  . Sexual activity: Yes  Other Topics Concern  . Not on file  Social History Narrative  . Not on file   Social Determinants of Health   Financial Resource Strain:   . Difficulty of Paying Living Expenses: Not on file  Food Insecurity:   . Worried About Programme researcher, broadcasting/film/videounning Out of Food in the Last Year: Not on file  . Ran Out of Food in the Last Year: Not on file  Transportation Needs:   . Lack of Transportation (Medical): Not on file  . Lack of Transportation (Non-Medical): Not on file  Physical Activity:   . Days of Exercise per Week: Not on file  . Minutes of Exercise per Session: Not on file  Stress:   . Feeling of Stress : Not on file  Social Connections:   . Frequency of Communication with Friends and Family: Not on file  . Frequency of Social Gatherings with Friends and Family: Not on file  . Attends Religious Services: Not on file  . Active Member of Clubs or Organizations: Not on file  . Attends BankerClub or Organization Meetings: Not on file  . Marital Status: Not on file   Additional Social History:    Allergies:  No Known Allergies  Labs:  Results for orders placed or performed during the hospital encounter of 08/01/20 (from the past 48 hour(s))  Comprehensive metabolic panel     Status: Abnormal   Collection Time: 08/01/20  9:56 AM  Result Value Ref Range   Sodium 137 135 - 145 mmol/L   Potassium 3.3 (L) 3.5 - 5.1 mmol/L   Chloride 101 98 - 111 mmol/L   CO2 26 22 - 32 mmol/L   Glucose, Bld 97 70 - 99 mg/dL    Comment: Glucose reference range applies only to samples taken after fasting for at least 8 hours.   BUN 17 6 - 20 mg/dL   Creatinine, Ser 4.090.78 0.61 - 1.24 mg/dL   Calcium 9.2 8.9  - 81.110.3 mg/dL   Total Protein 7.7 6.5 - 8.1 g/dL   Albumin 4.5 3.5 - 5.0 g/dL   AST 35 15 - 41 U/L   ALT 42 0 - 44 U/L   Alkaline Phosphatase 39 38 - 126 U/L   Total Bilirubin 1.4 (H) 0.3 - 1.2 mg/dL   GFR calc non Af Amer >60 >60 mL/min   Anion gap 10 5 - 15    Comment: Performed at [facility], [address] [place], [place] [address]  Ethanol     Status: None   Collection Time: 08/01/20  9:56 AM  Result Value Ref Range   Alcohol, Ethyl (  B) <10 <10 mg/dL    Comment: (NOTE) Lowest detectable limit for serum alcohol is 10 mg/dL.  For medical purposes only. Performed at [facility], [address] [place], [place] [address]   Salicylate level     Status: Abnormal   Collection Time: 08/01/20  9:56 AM  Result Value Ref Range   Salicylate Lvl <7.0 (L) 7.0 - 30.0 mg/dL    Comment: Performed at [facility], [address] [place], [place] [address]  Acetaminophen level     Status: Abnormal   Collection Time: 08/01/20  9:56 AM  Result Value Ref Range   Acetaminophen (Tylenol), Serum <10 (L) 10 - 30 ug/mL    Comment: (NOTE) Therapeutic concentrations vary significantly. A range of 10-30 ug/mL  may be an effective concentration for many patients. However, some  are best treated at concentrations outside of this range. Acetaminophen concentrations >150 ug/mL at 4 hours after ingestion  and >50 ug/mL at 12 hours after ingestion are often associated with  toxic reactions.  Performed at [facility], [address] [place], [place] [address]   cbc     Status: Abnormal   Collection Time: 08/01/20  9:56 AM  Result Value Ref Range   WBC 5.2 4.0 - 10.5 K/uL   RBC 5.01 4.22 - 5.81 MIL/uL   Hemoglobin 16.2 13.0 - 17.0 g/dL   HCT 10.6 39 - 52 %   MCV 88.6 80.0 - 100.0 fL   MCH 32.3 26.0 - 34.0 pg   MCHC 36.5 (H) 30.0 - 36.0 g/dL   RDW 26.9 48.5 - 46.2 %   Platelets 216 150 - 400 K/uL   nRBC 0.0 0.0 - 0.2 %    Comment: Performed at  [facility], [address] [place], [place] [address]  Urine Drug Screen, Qualitative     Status: Abnormal   Collection Time: 08/01/20  9:56 AM  Result Value Ref Range   Tricyclic, Ur Screen NONE DETECTED NONE DETECTED   Amphetamines, Ur Screen POSITIVE (A) NONE DETECTED   MDMA (Ecstasy)Ur Screen NONE DETECTED NONE DETECTED   Cocaine Metabolite,Ur [place] NONE DETECTED NONE DETECTED   Opiate, Ur Screen NONE DETECTED NONE DETECTED   Phencyclidine (PCP) Ur S NONE DETECTED NONE DETECTED   Cannabinoid 50 Ng, Ur [place] POSITIVE (A) NONE DETECTED   Barbiturates, Ur Screen NONE DETECTED NONE DETECTED   Benzodiazepine, Ur Scrn NONE DETECTED NONE DETECTED   Methadone Scn, Ur NONE DETECTED NONE DETECTED    Comment: (NOTE) Tricyclics + metabolites, urine    Cutoff 1000 ng/mL Amphetamines + metabolites, urine  Cutoff 1000 ng/mL MDMA (Ecstasy), urine              Cutoff 500 ng/mL Cocaine Metabolite, urine          Cutoff 300 ng/mL Opiate + metabolites, urine        Cutoff 300 ng/mL Phencyclidine (PCP), urine         Cutoff 25 ng/mL Cannabinoid, urine                 Cutoff 50 ng/mL Barbiturates + metabolites, urine  Cutoff 200 ng/mL Benzodiazepine, urine              Cutoff 200 ng/mL Methadone, urine                   Cutoff 300 ng/mL  The urine drug screen provides only a preliminary, unconfirmed analytical test result and should not be used  for non-medical purposes. Clinical consideration and professional judgment should be applied to any positive drug screen result due to possible interfering substances. A more specific alternate chemical method must be used in order to obtain a confirmed analytical result. Gas chromatography / mass spectrometry (GC/MS) is the preferred confirm atory method. Performed at [facility], [address] [place], [place] [address]     Current Facility-Administered Medications  Medication Dose Route Frequency Provider Last Rate Last Admin  .  DULoxetine (CYMBALTA) DR capsule 20 mg  20 mg Oral Daily Roselind Messier, MD      . risperiDONE (RISPERDAL) tablet 0.5 mg  0.5 mg Oral BID Roselind Messier, MD      . trihexyphenidyl (ARTANE) tablet 1 mg  1 mg Oral BID WC Roselind Messier, MD       Current Outpatient Medications  Medication Sig Dispense Refill  . clonazePAM (KLONOPIN) 1 MG tablet Take 1 mg by mouth.      Musculoskeletal: Strength & Muscle Tone: within normal limits Gait & Station: normal Patient leans: N/A  Psychiatric Specialty Exam: Physical Exam  Review of Systems  Blood pressure 114/77, pulse 76, temperature 98.2 F (36.8 C), temperature source Oral, resp. rate 18, height 6\' 5"  (1.956 m), weight 65.8 kg, SpO2 100 %.Body mass index is 17.19 kg/m.    Mental Status  Alert ruddy complex,  Looks haggard, sickly - Thin older than stated age Angry irritable frustrated ---edgy but cooperative Anxious and moody as well Eye contact--okay rapport okay No shakes tics tremors  No active SI and HI  UDS positive for meth amphetamines Thought process and content--paranoid fearful discusses   Memory remote recent immediate okay Judgement insight reliability all fair to poor Consciousness not clouded or fluctuant Concentration and attention fair  Abstraction okay   Somewhat concrete with recovery  Speech somewhat loud pressured anxious                                                    Leans na Handedness not known  Akathisia none Withdrawal yes  Assets --working at this time  ADL's--impaired if intoxicated Cognition impaired when intoxicated  Recall fair  Psychomotor okay  Aims not done Sleep on and off            Treatment Plan Summary:   Caucasian male seeks [facility] --and meds to help with above  One month of Cymbalta 20 daily  Artane one bid and Risperdal 0.5 bid given   He knows he has to follow up regularly   Encouraged strongly he join his Mom in recovery  meetings  She will pick up th is afternoon.   Disposition:  Home with meds and safety plan    , MD 08/01/2020 2:57 PM

## 2020-08-01 NOTE — BH Assessment (Signed)
Per request of Psych MD (Dr. Smith Robert), writer provided the pt. with information and instructions on how to access Outpatient Mental Health & Substance Abuse Treatment ([facility] and [facility]).  Patient denies SI/HI and AV/H. _________________  [facility] [address],  [place], [place] [address] [phone]  [facility] [address],  [place], [place] [address] [phone]

## 2020-08-01 NOTE — BH Assessment (Addendum)
Assessment Note  Bradley Bush is an 34 y.o. male who presents to the ER due to increase symptoms of anxiety and depression. Patient states, the symptoms are negatively affecting his quality of life and his interaction with family and love ones. Symptoms he shared are; isolating, lack of motivation to do things and complete tasks. Hes becoming more and more paranoid. He believes people are talking about him and or laughing at him. He quit his job because he was unable to manage the thoughts and feelings of people talking about him. Patient was a Airline pilot. He further explains, for the last year he has had five different jobs because he was unable to concentrate and cope with the thoughts of people talking and making fun of him. He also shares, his family have distance themselves because the do not like been around him, because hes too depressed and down.  Patient admits to the use of methamphetamine, suboxone and cannabis. He has used it to cope with the stressors and his anxiety.  During the interview, the patient was calm, cooperative, and pleasant. He was able to provide appropriate answers to the questions. He denies SI/HI and AV/H. He has no involvement with the legal system. He also denies history of violence and aggression.  Diagnosis: Major Depression  Past Medical History:  Past Medical History:  Diagnosis Date   Drug abuse (HCC)    Hepatitis C     History reviewed. No pertinent surgical history.  Family History: No family history on file.  Social History:  reports that he has been smoking cigarettes. He has never used smokeless tobacco. He reports current alcohol use. He reports current drug use. Drugs: Cocaine and IV.  Additional Social History:  Alcohol / Drug Use Pain Medications: See PTA Prescriptions: See PTA Over the Counter: See PTA History of alcohol / drug use?: Yes Longest period of sobriety (when/how long): Unable to quantify Substance #1 Name of Substance 1:  Methamphetamine 1 - Frequency: Two to three times a week Substance #2 Name of Substance 2: Suboxone 2 - Frequency: Daily Substance #3 Name of Substance 3: Cannabis 3 - Frequency: "Everyday if I can."  CIWA: CIWA-Ar BP: 114/77 Pulse Rate: 76 COWS:    Allergies: No Known Allergies  Home Medications: (Not in a hospital admission)   OB/GYN Status:  No LMP for male patient.  General Assessment Data Location of Assessment: [facility] ED TTS Assessment: In system Is this a Tele or Face-to-Face Assessment?: Face-to-Face Is this an Initial Assessment or a Re-assessment for this encounter?: Initial Assessment Patient Accompanied by:: N/A Language Other than English: No Living Arrangements: Other (Comment) (Private Home) What gender do you identify as?: Male Date Telepsych consult ordered in [facility]: 08/01/20 Time Telepsych consult ordered in [facility]: 0943 Marital status: Single Pregnancy Status: No Living Arrangements: Alone, Parent (Between my own place and my parents) Can pt return to current living arrangement?: Yes Admission Status: Voluntary Is patient capable of signing voluntary admission?: Yes Referral Source: Self/Family/Friend Insurance type: None  Medical Screening Exam [facility] Walk-in ONLY) Medical Exam completed: Yes  Crisis Care Plan Living Arrangements: Alone, Parent (Between my own place and my parents) Legal Guardian: Other: (Self) Name of Psychiatrist: Reports of none Name of Therapist: Reports of none  Education Status Is patient currently in school?: No Is the patient employed, unemployed or receiving disability?: Unemployed  Risk to self with the past 6 months Suicidal Ideation: No Has patient been a risk to self within the past 6 months prior  to admission? : No Suicidal Intent: No Has patient had any suicidal intent within the past 6 months prior to admission? : No Is patient at risk for suicide?: No Suicidal Plan?: No Has patient had any suicidal plan within  the past 6 months prior to admission? : No Access to Means: No What has been your use of drugs/alcohol within the last 12 months?: Methamphetamine, suboxone, cannabis Previous Attempts/Gestures: No How many times?: 0 Other Self Harm Risks: Active substance use Triggers for Past Attempts: None known Intentional Self Injurious Behavior: None Family Suicide History: No Recent stressful life event(s): Other (Comment) Persecutory voices/beliefs?: No Depression: Yes Depression Symptoms: Isolating, Fatigue, Guilt, Loss of interest in usual pleasures, Feeling worthless/self pity, Tearfulness, Insomnia Substance abuse history and/or treatment for substance abuse?: Yes Suicide prevention information given to non-admitted patients: Not applicable  Risk to Others within the past 6 months Homicidal Ideation: No Does patient have any lifetime risk of violence toward others beyond the six months prior to admission? : No Thoughts of Harm to Others: No Current Homicidal Intent: No Current Homicidal Plan: No Access to Homicidal Means: No Identified Victim: Reports of none History of harm to others?: No Assessment of Violence: None Noted Violent Behavior Description: Reports of none Does patient have access to weapons?: No Criminal Charges Pending?: No Does patient have a court date: No Is patient on probation?: No  Psychosis Hallucinations: None noted Delusions: None noted  Mental Status Report Appearance/Hygiene: Unremarkable, In scrubs Eye Contact: Fair Motor Activity: Freedom of movement, Unremarkable Speech: Logical/coherent, Unremarkable Level of Consciousness: Alert Mood: Anxious, Depressed, Sad, Pleasant Affect: Appropriate to circumstance, Depressed, Sad, Anxious Anxiety Level: Moderate Thought Processes: Coherent, Relevant Judgement: Unimpaired Orientation: Person, Place, Time, Situation, Appropriate for developmental age Obsessive Compulsive Thoughts/Behaviors:  Minimal  Cognitive Functioning Concentration: Decreased Memory: Recent Intact, Remote Intact Is patient IDD: No Insight: Fair Impulse Control: Fair Appetite: Good Have you had any weight changes? : No Change Sleep: Decreased Total Hours of Sleep: 6 Vegetative Symptoms: None  ADLScreening [facility] Assessment Services) Patient's cognitive ability adequate to safely complete daily activities?: Yes Patient able to express need for assistance with ADLs?: Yes Independently performs ADLs?: Yes (appropriate for developmental age)  Prior Inpatient Therapy Prior Inpatient Therapy: Yes Prior Therapy Dates: 2019 Prior Therapy Facilty/Provider(s): [facility] Reason for Treatment: Substance Abuse Treatment  Prior Outpatient Therapy Prior Outpatient Therapy: No Does patient have an ACCT team?: No Does patient have Intensive In-House Services?  : No Does patient have [facility] services? : No Does patient have P4CC services?: No  ADL Screening (condition at time of admission) Patient's cognitive ability adequate to safely complete daily activities?: Yes Is the patient deaf or have difficulty hearing?: No Does the patient have difficulty seeing, even when wearing glasses/contacts?: No Does the patient have difficulty concentrating, remembering, or making decisions?: No Patient able to express need for assistance with ADLs?: Yes Does the patient have difficulty dressing or bathing?: No Independently performs ADLs?: Yes (appropriate for developmental age) Does the patient have difficulty walking or climbing stairs?: No Weakness of Legs: None Weakness of Arms/Hands: None  Home Assistive Devices/Equipment Home Assistive Devices/Equipment: None  Therapy Consults (therapy consults require a physician order) PT Evaluation Needed: No OT Evalulation Needed: No SLP Evaluation Needed: No Abuse/Neglect Assessment (Assessment to be complete while patient is alone) Abuse/Neglect Assessment Can Be Completed:  Yes Physical Abuse: Denies Verbal Abuse: Denies Sexual Abuse: Denies Exploitation of patient/patient's resources: Denies Self-Neglect: Denies Values / Beliefs Cultural Requests During Hospitalization:  None Spiritual Requests During Hospitalization: None Consults Spiritual Care Consult Needed: No Transition of Care Team Consult Needed: No Advance Directives (For Healthcare) Does Patient Have a Medical Advance Directive?: No   Disposition:  Disposition Initial Assessment Completed for this Encounter: Yes  On Site Evaluation by:   Reviewed with Physician:    Lilyan Gilford MS, LCAS, [facility], [facility] Therapeutic Triage Specialist 08/01/2020 1:26 PM

## 2020-08-01 NOTE — Discharge Instructions (Signed)
You are being discharged with any medications to take moving forward We gave you a 30-day prescription for each of them, please follow-up with [facility] health services for further counseling and medications.  Return to the ED with any worsening symptoms.

## 2020-08-01 NOTE — ED Triage Notes (Addendum)
States "I need some mental health."  Says he has anxiety and feeling of doom on and off. Says he doesn't want to get out of bed.  Says he got out of prison a year ago and has not been able to keep a job.  Feels like people make fun of him.

## 2020-08-01 NOTE — ED Provider Notes (Signed)
[facility] Emergency Department Provider Note  ____________________________________________   First MD Initiated Contact with Patient 08/01/20 1116     (approximate)  I have reviewed the triage vital signs and the nursing notes.   HISTORY  Chief Complaint Psychiatric Evaluation    HPI Bradley Bush is a 34 y.o. male reports a history of drug abuse and presents voluntarily for evaluation of depression and anxiety. He said that his depression has been getting worse and worse recently although it has been present for years. He was incarcerated until about a year ago and has not been able to keep down a steady job. He said he feels like people are always making fun of him and he has a sense of impending doom (his words). He feels like he has nothing to live for and feels hopeless and helpless. He does not have any specific plan for how to kill himself but he is thought about it. He does not want to hurt anyone else. He describes his symptoms as gradual in onset and severe nothing in particular helps or makes his symptoms worse. He denies fever/chills, sore throat, chest pain, shortness of breath, nausea, vomiting, abdominal pain, and dysuria.        Past Medical History:  Diagnosis Date  . Drug abuse (HCC)   . Hepatitis C     There are no problems to display for this patient.   History reviewed. No pertinent surgical history.  Prior to Admission medications   Medication Sig Start Date End Date Taking? Authorizing Provider  clonazePAM (KLONOPIN) 1 MG tablet Take 1 mg by mouth.    [provider]    Allergies Patient has no known allergies.  No family history on file.  Social History Social History   Tobacco Use  . Smoking status: Current Every Day Smoker    Types: Cigarettes  . Smokeless tobacco: Never Used  Substance Use Topics  . Alcohol use: Yes  . Drug use: Yes    Types: Cocaine, IV    Review of Systems Constitutional: No  fever/chills Eyes: No visual changes. ENT: No sore throat. Cardiovascular: Denies chest pain. Respiratory: Denies shortness of breath. Gastrointestinal: No abdominal pain.  No nausea, no vomiting.  No diarrhea.  No constipation. Genitourinary: Negative for dysuria. Musculoskeletal: Negative for neck pain.  Negative for back pain. Integumentary: Negative for rash. Neurological: Negative for headaches, focal weakness or numbness. Psychiatric:  Sense of "impending doom", feelings that people are making fun of him, feelings of worthlessness, helplessness, and hopelessness.  ____________________________________________   PHYSICAL EXAM:  VITAL SIGNS: ED Triage Vitals  Enc Vitals Group     BP 08/01/20 1158 114/77     Pulse Rate 08/01/20 1158 76     Resp 08/01/20 1158 18     Temp 08/01/20 1158 98.2 F (36.8 C)     Temp Source 08/01/20 1158 Oral     SpO2 08/01/20 1158 100 %     Weight 08/01/20 0950 65.8 kg (145 lb)     Height 08/01/20 0950 1.956 m (6\' 5" )     Head Circumference --      Peak Flow --      Pain Score 08/01/20 0945 5     Pain Loc --      Pain Edu? --      Excl. in GC? --     Constitutional: Alert and oriented.  Eyes: Conjunctivae are normal.  Head: Atraumatic. Nose: No congestion/rhinnorhea. Mouth/Throat: Patient is wearing a  mask. Neck: No stridor.  No meningeal signs.   Cardiovascular: Normal rate, regular rhythm. Good peripheral circulation. Grossly normal heart sounds. Respiratory: Normal respiratory effort.  No retractions. Gastrointestinal: Soft and nontender. No distention.  Musculoskeletal: No lower extremity tenderness nor edema. No gross deformities of extremities. Neurologic:  Normal speech and language. No gross focal neurologic deficits are appreciated.  Skin:  Skin is warm, dry and intact. Psychiatric: Mood and affect are calm and cooperative, depressed, blunted affect. Denies SI but states that he feels  hopeless.  ____________________________________________   LABS (all labs ordered are listed, but only abnormal results are displayed)  Labs Reviewed  COMPREHENSIVE METABOLIC PANEL - Abnormal; Notable for the following components:      Result Value   Potassium 3.3 (*)    Total Bilirubin 1.4 (*)    All other components within normal limits  SALICYLATE LEVEL - Abnormal; Notable for the following components:   Salicylate Lvl <7.0 (*)    All other components within normal limits  ACETAMINOPHEN LEVEL - Abnormal; Notable for the following components:   Acetaminophen (Tylenol), Serum <10 (*)    All other components within normal limits  CBC - Abnormal; Notable for the following components:   MCHC 36.5 (*)    All other components within normal limits  URINE DRUG SCREEN, QUALITATIVE ([facility] ONLY) - Abnormal; Notable for the following components:   Amphetamines, Ur Screen POSITIVE (*)    Cannabinoid 50 Ng, Ur [place] POSITIVE (*)    All other components within normal limits  RESPIRATORY PANEL BY RT PCR (FLU A&B, COVID)  ETHANOL   ____________________________________________  EKG  No indication for emergent EKG ____________________________________________  RADIOLOGY I, Loleta Rose, personally viewed and evaluated these images (plain radiographs) as part of my medical decision making, as well as reviewing the written report by the radiologist.  ED MD interpretation: No indication for emergent imaging  Official radiology report(s): No results found.  ____________________________________________   PROCEDURES   Procedure(s) performed (including Critical Care):  Procedures   ____________________________________________   INITIAL IMPRESSION / MDM / ASSESSMENT AND PLAN / ED COURSE  As part of my medical decision making, I reviewed the following data within the electronic MEDICAL RECORD NUMBER Nursing notes reviewed and incorporated, Labs reviewed , Old chart reviewed, A consult was  requested and obtained from this/these consultant(s) Psychiatry and Notes from prior ED visits   Differential diagnosis includes, but is not limited to, depression, adjustment disorder, mood disorder, polysubstance abuse. The patient states that he takes Suboxone every day to avoid going back on heroin. He says that he uses meth sometimes but has not used it for days. He is feeling helpless and hopeless about his situation. He has no active SI at this time. He is here voluntarily and does not meet criteria for IVC. His vital signs are stable and his labs are reassuring with no evidence of an acute or emergent medical issue.  Consulting TTS and psychiatry.  The patient has been placed in psychiatric observation due to the need to provide a safe environment for the patient while obtaining psychiatric consultation and evaluation, as well as ongoing medical and medication management to treat the patient's condition.  The patient has not been placed under full IVC at this time.            ____________________________________________  FINAL CLINICAL IMPRESSION(S) / ED DIAGNOSES  Final diagnoses:  Anxiety  Depression, unspecified depression type  Polysubstance abuse (HCC)     MEDICATIONS GIVEN DURING  THIS VISIT:  Medications - No data to display   ED Discharge Orders    None      *Please note:  Bradley Bush was evaluated in Emergency Department on 08/01/2020 for the symptoms described in the history of present illness. He was evaluated in the context of the global COVID-19 pandemic, which necessitated consideration that the patient might be at risk for infection with the SARS-CoV-2 virus that causes COVID-19. Institutional protocols and algorithms that pertain to the evaluation of patients at risk for COVID-19 are in a state of rapid change based on information released by regulatory bodies including the CDC and federal and state organizations. These policies and algorithms were followed  during the patient's care in the ED.  Some ED evaluations and interventions may be delayed as a result of limited staffing during and after the pandemic.*  Note:  This document was prepared using Dragon voice recognition software and may include unintentional dictation errors.   Loleta Rose, MD 08/01/20 1247

## 2020-08-24 ENCOUNTER — Emergency Department
Admission: EM | Admit: 2020-08-24 | Discharge: 2020-08-24 | Discharge status: Against Medical Advice | Payer: Self-pay | Attending: Emergency Medicine | Admitting: Emergency Medicine

## 2020-08-24 ENCOUNTER — Other Ambulatory Visit: Payer: Self-pay

## 2020-08-24 DIAGNOSIS — F32A Depression, unspecified: Secondary | ICD-10-CM

## 2020-08-24 DIAGNOSIS — F17210 Nicotine dependence, cigarettes, uncomplicated: Secondary | ICD-10-CM | POA: Insufficient documentation

## 2020-08-24 DIAGNOSIS — F1920 Other psychoactive substance dependence, uncomplicated: Secondary | ICD-10-CM | POA: Diagnosis present

## 2020-08-24 DIAGNOSIS — F1910 Other psychoactive substance abuse, uncomplicated: Secondary | ICD-10-CM | POA: Insufficient documentation

## 2020-08-24 DIAGNOSIS — F329 Major depressive disorder, single episode, unspecified: Secondary | ICD-10-CM | POA: Insufficient documentation

## 2020-08-24 DIAGNOSIS — Z79899 Other long term (current) drug therapy: Secondary | ICD-10-CM | POA: Insufficient documentation

## 2020-08-24 MED ORDER — GABAPENTIN 300 MG PO CAPS: 300.0000 mg | ORAL_CAPSULE | Freq: Three times a day (TID) | ORAL | Status: DC

## 2020-08-24 NOTE — ED Notes (Signed)
Pt walked quickly by nurses station.  States he no longer wishes to stay.  Pt left AMA, without signature.  No IV access.  Provider notified.

## 2020-08-24 NOTE — ED Provider Notes (Addendum)
[facility] Emergency Department Provider Note  Time seen: 7:33 AM  I have reviewed the triage vital signs and the nursing notes.   HISTORY  Chief Complaint Detox  HPI Bradley Bush is a 34 y.o. male with a past medical history of substance abuse presents to the emergency department hoping for help with detox.  According to the patient he has been using oral pain killers Klonopin and occasionally methamphetamine.  Patient has been trying to get in with a detox center but has been unsuccessful so he came to the emergency department hoping for help.  Patient states his last use was around 6 PM yesterday.  Denies alcohol use.  Denies any medical complaints.   Past Medical History:  Diagnosis Date  . Drug abuse (HCC)   . Hepatitis C     There are no problems to display for this patient.   No past surgical history on file.  Prior to Admission medications   Medication Sig Start Date End Date Taking? Authorizing Provider  clonazePAM (KLONOPIN) 1 MG tablet Take 1 mg by mouth.    [provider]  DULoxetine (CYMBALTA) 20 MG capsule Take 1 capsule (20 mg total) by mouth daily. 08/01/20 08/31/20  Delton Prairie, MD  risperiDONE (RISPERDAL) 0.5 MG tablet Take 1 tablet (0.5 mg total) by mouth 2 (two) times daily. 08/01/20 08/31/20  Delton Prairie, MD  trihexyphenidyl (ARTANE) 2 MG tablet Take 0.5 tablets (1 mg total) by mouth 2 (two) times daily with a meal. 08/01/20 08/31/20  Delton Prairie, MD    No Known Allergies  No family history on file.  Social History Social History   Tobacco Use  . Smoking status: Current Every Day Smoker    Types: Cigarettes  . Smokeless tobacco: Never Used  Substance Use Topics  . Alcohol use: Yes  . Drug use: Yes    Types: Cocaine, IV    Review of Systems Constitutional: Negative for fever. Cardiovascular: Negative for chest pain. Respiratory: Negative for shortness of breath. Gastrointestinal: Negative for abdominal  pain Musculoskeletal: Negative for musculoskeletal complaints Neurological: Negative for headache All other ROS negative  ____________________________________________   PHYSICAL EXAM:  VITAL SIGNS: ED Triage Vitals  Enc Vitals Group     BP 08/24/20 0314 111/68     Pulse Rate 08/24/20 0314 95     Resp 08/24/20 0314 18     Temp 08/24/20 0314 98.7 F (37.1 C)     Temp Source 08/24/20 0314 Oral     SpO2 08/24/20 0314 99 %     Weight 08/24/20 0312 144 lb 13.5 oz (65.7 kg)     Height 08/24/20 0312 6\' 5"  (1.956 m)     Head Circumference --      Peak Flow --      Pain Score 08/24/20 0312 0     Pain Loc --      Pain Edu? --      Excl. in GC? --    Constitutional: Alert and oriented. Well appearing and in no distress. Eyes: Normal exam ENT      Head: Normocephalic and atraumatic.      Mouth/Throat: Mucous membranes are moist. Cardiovascular: Normal rate, regular rhythm. Respiratory: Normal respiratory effort without tachypnea nor retractions. Breath sounds are clear  Gastrointestinal: Soft and nontender. No distention.  Musculoskeletal: Nontender with normal range of motion in all extremities. Neurologic:  Normal speech and language. No gross focal neurologic deficits  Skin:  Skin is warm, dry and intact.  Psychiatric: Mood and affect are normal.   ____________________________________________   INITIAL IMPRESSION / ASSESSMENT AND PLAN / ED COURSE  Pertinent labs & imaging results that were available during my care of the patient were reviewed by me and considered in my medical decision making (see chart for details).   Patient presents emergency department for help with detox from there is substances.  Overall patient appears well reassuring vitals.  Reassuring physical exam.  Does not appear to be actively intoxicated.  We will speak with TTS to see if there are any resources that could be provided to the patient.  Patient has been attempting to contact various detox centers  without success.  After several hours of waiting for TTS.  We have made several phone calls and has noted that there is no TTS available onsite today.  I spoke to the patient about outpatient resources, he states he has been feeling very depressed recently and feels like he needs to speak to someone regarding his depression and anxiety.  States he is scared that he will start using drugs again and hurt himself by accident.  Denies any suicidal ideation.  We will have psychiatry evaluate the patient as well.  Does not appear to meet IVC criteria at this time.  Patient has eloped from the emergency department.  Was not under IVC.  Bradley Bush was evaluated in Emergency Department on 08/24/2020 for the symptoms described in the history of present illness. He was evaluated in the context of the global COVID-19 pandemic, which necessitated consideration that the patient might be at risk for infection with the SARS-CoV-2 virus that causes COVID-19. Institutional protocols and algorithms that pertain to the evaluation of patients at risk for COVID-19 are in a state of rapid change based on information released by regulatory bodies including the CDC and federal and state organizations. These policies and algorithms were followed during the patient's care in the ED.  ____________________________________________   FINAL CLINICAL IMPRESSION(S) / ED DIAGNOSES  Substance abuse Depression   Minna Antis, MD 08/24/20 1125    Minna Antis, MD 08/24/20 1204

## 2020-08-24 NOTE — ED Triage Notes (Signed)
Patient states he is here for detox.  Patient reports wanting help to stop using pain killers, meth and klonopin.  Patient states he has called several detox centers but always gets put on hold or no one answers.

## 2021-05-28 DIAGNOSIS — F411 Generalized anxiety disorder: Secondary | ICD-10-CM | POA: Insufficient documentation

## 2022-07-22 ENCOUNTER — Telehealth: Payer: Self-pay | Admitting: Family

## 2022-07-22 NOTE — Telephone Encounter (Signed)
Caller states: - Patient is looking to be treated for opioid use disorder and would need to start suboxene   Caller requests: -Confirmation that suboxene could be prescribed by Jeanie Sewer, the provider her plans on establishing care with.   Caller states patient is not available to take calls at this time. States he could be reached with this info at [phone].

## 2022-07-22 NOTE — Telephone Encounter (Signed)
Bradley Bush spoke with pt's mental health advisor in regards.

## 2022-07-25 NOTE — Progress Notes (Deleted)
   New Patient Office Visit  Subjective:  Patient ID: Bradley Bush, male    DOB: [birth year]  Age: 36 y.o. MRN: [id number]  CC: No chief complaint on file.   HPI Bradley Bush presents for establishing care today.  Assessment & Plan:   Problem List Items Addressed This Visit   None   Subjective:    No outpatient medications prior to visit.   No facility-administered medications prior to visit.   No past medical history on file. *** The histories are not reviewed yet. Please review them in the "History" navigator section and refresh this [place].  Objective:   Today's Vitals: There were no vitals taken for this visit.  Physical Exam  No orders of the defined types were placed in this encounter.   Jeanie Sewer, NP

## 2022-07-26 ENCOUNTER — Ambulatory Visit: Payer: Self-pay | Admitting: Family

## 2022-11-24 ENCOUNTER — Other Ambulatory Visit: Payer: Self-pay | Admitting: Physician Assistant

## 2022-11-24 ENCOUNTER — Encounter: Payer: Self-pay | Admitting: Physician Assistant

## 2022-11-24 ENCOUNTER — Ambulatory Visit (INDEPENDENT_AMBULATORY_CARE_PROVIDER_SITE_OTHER): Payer: Medicaid Other | Admitting: Physician Assistant

## 2022-11-24 VITALS — BP 120/84 | HR 68 | Temp 98.3°F | Ht 77.0 in | Wt 180.0 lb

## 2022-11-24 DIAGNOSIS — M5440 Lumbago with sciatica, unspecified side: Secondary | ICD-10-CM | POA: Diagnosis not present

## 2022-11-24 DIAGNOSIS — F17290 Nicotine dependence, other tobacco product, uncomplicated: Secondary | ICD-10-CM

## 2022-11-24 DIAGNOSIS — Z7689 Persons encountering health services in other specified circumstances: Secondary | ICD-10-CM

## 2022-11-24 DIAGNOSIS — F1920 Other psychoactive substance dependence, uncomplicated: Secondary | ICD-10-CM

## 2022-11-24 DIAGNOSIS — F17200 Nicotine dependence, unspecified, uncomplicated: Secondary | ICD-10-CM

## 2022-11-24 DIAGNOSIS — F39 Unspecified mood [affective] disorder: Secondary | ICD-10-CM

## 2022-11-24 DIAGNOSIS — F419 Anxiety disorder, unspecified: Secondary | ICD-10-CM | POA: Diagnosis not present

## 2022-11-24 DIAGNOSIS — Z8619 Personal history of other infectious and parasitic diseases: Secondary | ICD-10-CM
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
## 2022-11-24 NOTE — Telephone Encounter (Signed)
Please advise 

## 2022-11-25 ENCOUNTER — Ambulatory Visit
Admission: RE | Admit: 2022-11-25 | Discharge: 2022-11-25 | Disposition: A | Discharge status: Home / Self Care | Payer: Medicaid Other | Source: Ambulatory Visit | Attending: Physician Assistant | Admitting: Physician Assistant

## 2022-11-25 ENCOUNTER — Ambulatory Visit
Admission: RE | Admit: 2022-11-25 | Discharge: 2022-11-25 | Disposition: A | Discharge status: Home / Self Care | Payer: Medicaid Other | Attending: Physician Assistant | Admitting: Physician Assistant

## 2022-11-25 ENCOUNTER — Other Ambulatory Visit: Payer: Self-pay | Admitting: Physician Assistant

## 2022-11-25 DIAGNOSIS — F17200 Nicotine dependence, unspecified, uncomplicated: Secondary | ICD-10-CM | POA: Insufficient documentation

## 2022-11-25 DIAGNOSIS — R0989 Other specified symptoms and signs involving the circulatory and respiratory systems: Secondary | ICD-10-CM

## 2022-11-26 ENCOUNTER — Telehealth: Payer: Self-pay

## 2022-11-26 DIAGNOSIS — F17200 Nicotine dependence, unspecified, uncomplicated: Secondary | ICD-10-CM | POA: Insufficient documentation

## 2022-11-26 DIAGNOSIS — Z7689 Persons encountering health services in other specified circumstances: Secondary | ICD-10-CM | POA: Insufficient documentation

## 2022-11-26 DIAGNOSIS — M5440 Lumbago with sciatica, unspecified side: Secondary | ICD-10-CM | POA: Insufficient documentation

## 2022-11-26 DIAGNOSIS — F419 Anxiety disorder, unspecified: Secondary | ICD-10-CM | POA: Insufficient documentation

## 2022-11-26 DIAGNOSIS — F39 Unspecified mood [affective] disorder: Secondary | ICD-10-CM | POA: Insufficient documentation

## 2022-11-26 DIAGNOSIS — Z8619 Personal history of other infectious and parasitic diseases: Secondary | ICD-10-CM | POA: Insufficient documentation

## 2022-11-26 LAB — CBC WITH DIFFERENTIAL/PLATELET
Basophils Absolute: 0 10*3/uL (ref 0.0–0.2)
Basos: 1 %
EOS (ABSOLUTE): 0.2 10*3/uL (ref 0.0–0.4)
Eos: 3 %
Hematocrit: 45.2 % (ref 37.5–51.0)
Hemoglobin: 15.3 g/dL (ref 13.0–17.7)
Immature Grans (Abs): 0 10*3/uL (ref 0.0–0.1)
Immature Granulocytes: 0 %
Lymphocytes Absolute: 2 10*3/uL (ref 0.7–3.1)
Lymphs: 36 %
MCH: 31.3 pg (ref 26.6–33.0)
MCHC: 33.8 g/dL (ref 31.5–35.7)
MCV: 92 fL (ref 79–97)
Monocytes Absolute: 0.5 10*3/uL (ref 0.1–0.9)
Monocytes: 8 %
Neutrophils Absolute: 2.9 10*3/uL (ref 1.4–7.0)
Neutrophils: 52 %
Platelets: 208 10*3/uL (ref 150–450)
RBC: 4.89 x10E6/uL (ref 4.14–5.80)
RDW: 12.6 % (ref 11.6–15.4)
WBC: 5.5 10*3/uL (ref 3.4–10.8)

## 2022-11-26 LAB — COMPREHENSIVE METABOLIC PANEL
ALT: 33 IU/L (ref 0–44)
AST: 24 IU/L (ref 0–40)
Albumin/Globulin Ratio: 1.5 (ref 1.2–2.2)
Albumin: 4.5 g/dL (ref 4.1–5.1)
Alkaline Phosphatase: 72 IU/L (ref 44–121)
BUN/Creatinine Ratio: 17 (ref 9–20)
BUN: 15 mg/dL (ref 6–20)
Bilirubin Total: 0.5 mg/dL (ref 0.0–1.2)
CO2: 23 mmol/L (ref 20–29)
Calcium: 9.6 mg/dL (ref 8.7–10.2)
Chloride: 102 mmol/L (ref 96–106)
Creatinine, Ser: 0.87 mg/dL (ref 0.76–1.27)
Globulin, Total: 3 g/dL (ref 1.5–4.5)
Glucose: 109 mg/dL — ABNORMAL HIGH (ref 70–99)
Potassium: 4.6 mmol/L (ref 3.5–5.2)
Sodium: 141 mmol/L (ref 134–144)
Total Protein: 7.5 g/dL (ref 6.0–8.5)
eGFR: 113 mL/min/{1.73_m2} (ref >59)

## 2022-11-26 LAB — HEPATITIS B SURFACE ANTIBODY,QUALITATIVE: Hep B Surface Ab, Qual: REACTIVE

## 2022-11-26 LAB — HEPATITIS C ANTIBODY: Hep C Virus Ab: REACTIVE — AB

## 2022-11-26 LAB — HEPATITIS A ANTIBODY, TOTAL: hep A Total Ab: POSITIVE — AB

## 2022-11-26 LAB — HEPATITIS B SURFACE ANTIGEN: Hepatitis B Surface Ag: NEGATIVE

## 2022-11-26 LAB — HIV ANTIBODY (ROUTINE TESTING W REFLEX): HIV Screen 4th Generation wRfx: NONREACTIVE

## 2022-11-26 LAB — HEPATITIS B CORE AB W/REFLEX: Hep B Core Total Ab: NEGATIVE

## 2022-11-26 NOTE — Telephone Encounter (Signed)
-----   Message from [name], [place] sent at 11/26/2022 12:39 PM EST ----- Please, let pt know that we dnt Rx suboxone in our clinic ----- Message ----- From: Doristine Devoid, [place]: 11/26/2022  10:15 AM EST To: [name], PA-C  Letitia Libra I do not see a referral to ID for him ----- Message ----- From: [name], [place] Sent: 11/26/2022   8:28 AM EST To: [place] Nurse  Please let pt know that his hep C and hep A positive. I will refer you to infection dx specialist for treatment

## 2022-11-26 NOTE — Telephone Encounter (Signed)
The pt was notified of that we do not prescribed suboxone in our office.He verbalize understanding.

## 2022-11-26 NOTE — Progress Notes (Signed)
Please, let pt know that his CXR showed no active cardiopulmonary disease

## 2022-11-26 NOTE — Progress Notes (Signed)
Please let pt know that his hep C and hep A positive. I will refer you to infection dx specialist for treatment

## 2022-11-30 ENCOUNTER — Emergency Department: Payer: 59

## 2022-11-30 ENCOUNTER — Encounter: Payer: Self-pay | Admitting: Emergency Medicine

## 2022-11-30 ENCOUNTER — Inpatient Hospital Stay
Admission: EM | Admit: 2022-11-30 | Discharge: 2022-12-03 | DRG: 481 | Disposition: A | Discharge status: Home / Self Care | Payer: 59 | Attending: Internal Medicine | Admitting: Internal Medicine

## 2022-11-30 DIAGNOSIS — Y92410 Unspecified street and highway as the place of occurrence of the external cause: Secondary | ICD-10-CM

## 2022-11-30 DIAGNOSIS — S72145A Nondisplaced intertrochanteric fracture of left femur, initial encounter for closed fracture: Secondary | ICD-10-CM | POA: Diagnosis not present

## 2022-11-30 DIAGNOSIS — F1911 Other psychoactive substance abuse, in remission: Secondary | ICD-10-CM | POA: Insufficient documentation

## 2022-11-30 DIAGNOSIS — F1920 Other psychoactive substance dependence, uncomplicated: Secondary | ICD-10-CM | POA: Diagnosis present

## 2022-11-30 DIAGNOSIS — F17200 Nicotine dependence, unspecified, uncomplicated: Secondary | ICD-10-CM | POA: Diagnosis present

## 2022-11-30 DIAGNOSIS — Z8619 Personal history of other infectious and parasitic diseases: Secondary | ICD-10-CM | POA: Diagnosis present

## 2022-11-30 DIAGNOSIS — R768 Other specified abnormal immunological findings in serum: Secondary | ICD-10-CM | POA: Insufficient documentation

## 2022-11-30 DIAGNOSIS — S72002A Fracture of unspecified part of neck of left femur, initial encounter for closed fracture: Secondary | ICD-10-CM | POA: Diagnosis not present

## 2022-11-30 DIAGNOSIS — F1120 Opioid dependence, uncomplicated: Secondary | ICD-10-CM | POA: Diagnosis present

## 2022-11-30 DIAGNOSIS — Z833 Family history of diabetes mellitus: Secondary | ICD-10-CM

## 2022-11-30 DIAGNOSIS — F1411 Cocaine abuse, in remission: Secondary | ICD-10-CM | POA: Diagnosis present

## 2022-11-30 DIAGNOSIS — F17210 Nicotine dependence, cigarettes, uncomplicated: Secondary | ICD-10-CM | POA: Diagnosis present

## 2022-11-30 DIAGNOSIS — B159 Hepatitis A without hepatic coma: Secondary | ICD-10-CM | POA: Insufficient documentation

## 2022-11-30 DIAGNOSIS — S50312A Abrasion of left elbow, initial encounter: Secondary | ICD-10-CM | POA: Diagnosis present

## 2022-11-30 DIAGNOSIS — B1920 Unspecified viral hepatitis C without hepatic coma: Secondary | ICD-10-CM | POA: Diagnosis present

## 2022-11-30 DIAGNOSIS — S80212A Abrasion, left knee, initial encounter: Secondary | ICD-10-CM | POA: Diagnosis present

## 2022-11-30 DIAGNOSIS — M5440 Lumbago with sciatica, unspecified side: Secondary | ICD-10-CM | POA: Diagnosis present

## 2022-11-30 MED ORDER — MORPHINE SULFATE (PF) 4 MG/ML IV SOLN
4.0000 mg | Freq: Once | INTRAVENOUS | Status: AC
  Administered 2022-12-01: 4 mg via INTRAVENOUS
  Filled 2022-11-30: qty 1

## 2022-11-30 MED ORDER — ONDANSETRON HCL 4 MG/2ML IJ SOLN
4.0000 mg | Freq: Once | INTRAMUSCULAR | Status: AC
  Administered 2022-12-01: 4 mg via INTRAVENOUS
  Filled 2022-11-30: qty 2

## 2022-11-30 MED ORDER — SODIUM CHLORIDE 0.9 % IV SOLN: INTRAVENOUS | Status: DC
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
## 2022-12-01 ENCOUNTER — Encounter: Payer: Self-pay | Admitting: Internal Medicine

## 2022-12-01 ENCOUNTER — Encounter
Admission: EM | Disposition: A | Discharge status: Home / Self Care | Payer: Self-pay | Source: Home / Self Care | Attending: Internal Medicine

## 2022-12-01 ENCOUNTER — Other Ambulatory Visit: Payer: Self-pay

## 2022-12-01 ENCOUNTER — Inpatient Hospital Stay: Payer: 59

## 2022-12-01 ENCOUNTER — Inpatient Hospital Stay: Payer: 59 | Admitting: Anesthesiology

## 2022-12-01 ENCOUNTER — Emergency Department: Payer: 59

## 2022-12-01 DIAGNOSIS — S72002A Fracture of unspecified part of neck of left femur, initial encounter for closed fracture: Secondary | ICD-10-CM | POA: Diagnosis present

## 2022-12-01 DIAGNOSIS — S72145A Nondisplaced intertrochanteric fracture of left femur, initial encounter for closed fracture: Secondary | ICD-10-CM | POA: Diagnosis present

## 2022-12-01 DIAGNOSIS — R768 Other specified abnormal immunological findings in serum: Secondary | ICD-10-CM | POA: Insufficient documentation

## 2022-12-01 DIAGNOSIS — Z833 Family history of diabetes mellitus: Secondary | ICD-10-CM | POA: Diagnosis not present

## 2022-12-01 DIAGNOSIS — Y92410 Unspecified street and highway as the place of occurrence of the external cause: Secondary | ICD-10-CM | POA: Diagnosis not present

## 2022-12-01 DIAGNOSIS — B1920 Unspecified viral hepatitis C without hepatic coma: Secondary | ICD-10-CM | POA: Diagnosis present

## 2022-12-01 DIAGNOSIS — B159 Hepatitis A without hepatic coma: Secondary | ICD-10-CM | POA: Diagnosis present

## 2022-12-01 DIAGNOSIS — M5440 Lumbago with sciatica, unspecified side: Secondary | ICD-10-CM | POA: Diagnosis present

## 2022-12-01 DIAGNOSIS — S80212A Abrasion, left knee, initial encounter: Secondary | ICD-10-CM | POA: Diagnosis present

## 2022-12-01 DIAGNOSIS — F1920 Other psychoactive substance dependence, uncomplicated: Secondary | ICD-10-CM | POA: Diagnosis not present

## 2022-12-01 DIAGNOSIS — F1120 Opioid dependence, uncomplicated: Secondary | ICD-10-CM | POA: Diagnosis present

## 2022-12-01 DIAGNOSIS — Z8619 Personal history of other infectious and parasitic diseases: Secondary | ICD-10-CM | POA: Diagnosis not present

## 2022-12-01 DIAGNOSIS — F1911 Other psychoactive substance abuse, in remission: Secondary | ICD-10-CM | POA: Insufficient documentation

## 2022-12-01 DIAGNOSIS — F17210 Nicotine dependence, cigarettes, uncomplicated: Secondary | ICD-10-CM | POA: Diagnosis present

## 2022-12-01 DIAGNOSIS — S50312A Abrasion of left elbow, initial encounter: Secondary | ICD-10-CM | POA: Diagnosis present

## 2022-12-01 DIAGNOSIS — F1411 Cocaine abuse, in remission: Secondary | ICD-10-CM | POA: Diagnosis present

## 2022-12-01 HISTORY — PX: INTRAMEDULLARY (IM) NAIL INTERTROCHANTERIC: SHX5875

## 2022-12-01 LAB — COMPREHENSIVE METABOLIC PANEL
ALT: 33 U/L (ref 0–44)
AST: 41 U/L (ref 15–41)
Albumin: 4.4 g/dL (ref 3.5–5.0)
Alkaline Phosphatase: 63 U/L (ref 38–126)
Anion gap: 9 (ref 5–15)
BUN: 14 mg/dL (ref 6–20)
CO2: 26 mmol/L (ref 22–32)
Calcium: 9.2 mg/dL (ref 8.9–10.3)
Chloride: 102 mmol/L (ref 98–111)
Creatinine, Ser: 1.02 mg/dL (ref 0.61–1.24)
GFR, Estimated: >60 mL/min (ref >60)
Glucose, Bld: 95 mg/dL (ref 70–99)
Potassium: 4.4 mmol/L (ref 3.5–5.1)
Sodium: 137 mmol/L (ref 135–145)
Total Bilirubin: 1 mg/dL (ref 0.3–1.2)
Total Protein: 7.8 g/dL (ref 6.5–8.1)

## 2022-12-01 LAB — CBC WITH DIFFERENTIAL/PLATELET
Abs Immature Granulocytes: 0.02 10*3/uL (ref 0.00–0.07)
Basophils Absolute: 0 10*3/uL (ref 0.0–0.1)
Basophils Relative: 1 %
Eosinophils Absolute: 0.1 10*3/uL (ref 0.0–0.5)
Eosinophils Relative: 1 %
HCT: 43.1 % (ref 39.0–52.0)
Hemoglobin: 14.8 g/dL (ref 13.0–17.0)
Immature Granulocytes: 0 %
Lymphocytes Relative: 21 %
Lymphs Abs: 1.5 10*3/uL (ref 0.7–4.0)
MCH: 30.8 pg (ref 26.0–34.0)
MCHC: 34.3 g/dL (ref 30.0–36.0)
MCV: 89.6 fL (ref 80.0–100.0)
Monocytes Absolute: 0.6 10*3/uL (ref 0.1–1.0)
Monocytes Relative: 8 %
Neutro Abs: 5.3 10*3/uL (ref 1.7–7.7)
Neutrophils Relative %: 69 %
Platelets: 211 10*3/uL (ref 150–400)
RBC: 4.81 MIL/uL (ref 4.22–5.81)
RDW: 12.4 % (ref 11.5–15.5)
WBC: 7.5 10*3/uL (ref 4.0–10.5)
nRBC: 0 % (ref 0.0–0.2)

## 2022-12-01 LAB — URINE DRUG SCREEN, QUALITATIVE (ARMC ONLY)
Amphetamines, Ur Screen: NOT DETECTED
Barbiturates, Ur Screen: NOT DETECTED
Benzodiazepine, Ur Scrn: NOT DETECTED
Cannabinoid 50 Ng, Ur ~~LOC~~: NOT DETECTED
Cocaine Metabolite,Ur ~~LOC~~: NOT DETECTED
MDMA (Ecstasy)Ur Screen: NOT DETECTED
Methadone Scn, Ur: NOT DETECTED
Opiate, Ur Screen: POSITIVE — AB
Phencyclidine (PCP) Ur S: NOT DETECTED
Tricyclic, Ur Screen: NOT DETECTED

## 2022-12-01 LAB — PROTIME-INR
INR: 1.2 (ref 0.8–1.2)
Prothrombin Time: 14.7 seconds (ref 11.4–15.2)

## 2022-12-01 LAB — ETHANOL: Alcohol, Ethyl (B): <10 mg/dL (ref <10)

## 2022-12-01 LAB — TYPE AND SCREEN
ABO/RH(D): O POS
Antibody Screen: NEGATIVE

## 2022-12-01 SURGERY — FIXATION, FRACTURE, INTERTROCHANTERIC, WITH INTRAMEDULLARY ROD
Anesthesia: Spinal | Site: Hip | Laterality: Left

## 2022-12-01 MED ORDER — BUPIVACAINE-EPINEPHRINE (PF) 0.5% -1:200000 IJ SOLN
INTRAMUSCULAR | Status: AC
  Filled 2022-12-01: qty 30

## 2022-12-01 MED ORDER — PHENYLEPHRINE HCL (PRESSORS) 10 MG/ML IV SOLN
INTRAVENOUS | Status: DC | PRN
  Administered 2022-12-01: 80 ug via INTRAVENOUS
  Administered 2022-12-01: 160 ug via INTRAVENOUS
  Administered 2022-12-01: 80 ug via INTRAVENOUS

## 2022-12-01 MED ORDER — ACETAMINOPHEN 10 MG/ML IV SOLN
INTRAVENOUS | Status: DC | PRN
  Administered 2022-12-01: 1000 mg via INTRAVENOUS

## 2022-12-01 MED ORDER — DOCUSATE SODIUM 100 MG PO CAPS
100.0000 mg | ORAL_CAPSULE | Freq: Two times a day (BID) | ORAL | Status: DC
  Administered 2022-12-01 – 2022-12-02 (×2): 100 mg via ORAL
  Filled 2022-12-01 (×4): qty 1

## 2022-12-01 MED ORDER — MIDAZOLAM HCL 2 MG/2ML IJ SOLN
INTRAMUSCULAR | Status: AC
  Filled 2022-12-01: qty 2

## 2022-12-01 MED ORDER — ORAL CARE MOUTH RINSE: 15.0000 mL | Freq: Once | OROMUCOSAL | Status: AC

## 2022-12-01 MED ORDER — FLEET ENEMA 7-19 GM/118ML RE ENEM: 1.0000 | ENEMA | Freq: Once | RECTAL | Status: DC | PRN

## 2022-12-01 MED ORDER — CEFAZOLIN SODIUM-DEXTROSE 2-4 GM/100ML-% IV SOLN
2.0000 g | INTRAVENOUS | Status: AC
  Administered 2022-12-01: 2 g via INTRAVENOUS

## 2022-12-01 MED ORDER — CEFAZOLIN SODIUM-DEXTROSE 2-4 GM/100ML-% IV SOLN
2.0000 g | Freq: Four times a day (QID) | INTRAVENOUS | Status: AC
  Administered 2022-12-02 (×2): 2 g via INTRAVENOUS
  Filled 2022-12-01 (×3): qty 100

## 2022-12-01 MED ORDER — KETAMINE HCL 10 MG/ML IJ SOLN
INTRAMUSCULAR | Status: DC | PRN
  Administered 2022-12-01: 10 mg via INTRAVENOUS
  Administered 2022-12-01 (×3): 20 mg via INTRAVENOUS

## 2022-12-01 MED ORDER — MORPHINE SULFATE (PF) 2 MG/ML IV SOLN
2.0000 mg | INTRAVENOUS | Status: DC | PRN
  Administered 2022-12-01 – 2022-12-02 (×5): 2 mg via INTRAVENOUS
  Filled 2022-12-01 (×5): qty 1

## 2022-12-01 MED ORDER — OXCARBAZEPINE 300 MG PO TABS
300.0000 mg | ORAL_TABLET | Freq: Every day | ORAL | Status: DC
  Filled 2022-12-01: qty 1

## 2022-12-01 MED ORDER — ONDANSETRON HCL 4 MG/2ML IJ SOLN: 4.0000 mg | Freq: Once | INTRAMUSCULAR | Status: DC | PRN

## 2022-12-01 MED ORDER — EPHEDRINE SULFATE (PRESSORS) 50 MG/ML IJ SOLN
INTRAMUSCULAR | Status: DC | PRN
  Administered 2022-12-01 (×2): 10 mg via INTRAVENOUS

## 2022-12-01 MED ORDER — FENTANYL CITRATE (PF) 100 MCG/2ML IJ SOLN
INTRAMUSCULAR | Status: AC
  Filled 2022-12-01: qty 2

## 2022-12-01 MED ORDER — KETOROLAC TROMETHAMINE 15 MG/ML IJ SOLN
15.0000 mg | Freq: Four times a day (QID) | INTRAMUSCULAR | Status: AC
  Administered 2022-12-01 – 2022-12-02 (×3): 15 mg via INTRAVENOUS
  Filled 2022-12-01 (×4): qty 1

## 2022-12-01 MED ORDER — ONDANSETRON HCL 4 MG/2ML IJ SOLN: 4.0000 mg | Freq: Four times a day (QID) | INTRAMUSCULAR | Status: DC | PRN

## 2022-12-01 MED ORDER — KETAMINE HCL 50 MG/5ML IJ SOSY
PREFILLED_SYRINGE | INTRAMUSCULAR | Status: AC
  Filled 2022-12-01: qty 5

## 2022-12-01 MED ORDER — ACETAMINOPHEN 325 MG PO TABS: 325.0000 mg | ORAL_TABLET | Freq: Four times a day (QID) | ORAL | Status: DC | PRN

## 2022-12-01 MED ORDER — BUPIVACAINE HCL (PF) 0.5 % IJ SOLN
INTRAMUSCULAR | Status: DC | PRN
  Administered 2022-12-01: 3 mL

## 2022-12-01 MED ORDER — CEFAZOLIN SODIUM-DEXTROSE 2-4 GM/100ML-% IV SOLN
INTRAVENOUS | Status: AC
  Administered 2022-12-01: 2 g via INTRAVENOUS
  Filled 2022-12-01: qty 100

## 2022-12-01 MED ORDER — KETOROLAC TROMETHAMINE 30 MG/ML IJ SOLN: 30.0000 mg | Freq: Once | INTRAMUSCULAR | Status: AC

## 2022-12-01 MED ORDER — ONDANSETRON HCL 4 MG PO TABS: 4.0000 mg | ORAL_TABLET | Freq: Four times a day (QID) | ORAL | Status: DC | PRN

## 2022-12-01 MED ORDER — ONDANSETRON HCL 4 MG/2ML IJ SOLN
INTRAMUSCULAR | Status: DC | PRN
  Administered 2022-12-01: 4 mg via INTRAVENOUS

## 2022-12-01 MED ORDER — MAGNESIUM HYDROXIDE 400 MG/5ML PO SUSP: 30.0000 mL | Freq: Every day | ORAL | Status: DC | PRN

## 2022-12-01 MED ORDER — NICOTINE 21 MG/24HR TD PT24
21.0000 mg | MEDICATED_PATCH | Freq: Every day | TRANSDERMAL | Status: DC
  Administered 2022-12-02 – 2022-12-03 (×2): 21 mg via TRANSDERMAL
  Filled 2022-12-01 (×2): qty 1

## 2022-12-01 MED ORDER — ENOXAPARIN SODIUM 40 MG/0.4ML IJ SOSY
40.0000 mg | PREFILLED_SYRINGE | INTRAMUSCULAR | Status: DC
  Administered 2022-12-02 – 2022-12-03 (×2): 40 mg via SUBCUTANEOUS
  Filled 2022-12-01 (×2): qty 0.4

## 2022-12-01 MED ORDER — HYDROCODONE-ACETAMINOPHEN 5-325 MG PO TABS
1.0000 | ORAL_TABLET | Freq: Four times a day (QID) | ORAL | Status: DC | PRN
  Administered 2022-12-01 – 2022-12-03 (×6): 2 via ORAL
  Filled 2022-12-01 (×7): qty 2

## 2022-12-01 MED ORDER — METOCLOPRAMIDE HCL 5 MG PO TABS: 5.0000 mg | ORAL_TABLET | Freq: Three times a day (TID) | ORAL | Status: DC | PRN

## 2022-12-01 MED ORDER — DEXAMETHASONE SODIUM PHOSPHATE 10 MG/ML IJ SOLN
INTRAMUSCULAR | Status: DC | PRN
  Administered 2022-12-01: 10 mg via INTRAVENOUS

## 2022-12-01 MED ORDER — CHLORHEXIDINE GLUCONATE 0.12 % MT SOLN
OROMUCOSAL | Status: AC
  Administered 2022-12-01: 15 mL via OROMUCOSAL
  Filled 2022-12-01: qty 15

## 2022-12-01 MED ORDER — FENTANYL CITRATE (PF) 100 MCG/2ML IJ SOLN
INTRAMUSCULAR | Status: DC | PRN
  Administered 2022-12-01 (×2): 50 ug via INTRAVENOUS

## 2022-12-01 MED ORDER — LACTATED RINGERS IV SOLN: INTRAVENOUS | Status: DC | PRN

## 2022-12-01 MED ORDER — PROPOFOL 10 MG/ML IV BOLUS
INTRAVENOUS | Status: DC | PRN
  Administered 2022-12-01: 20 mg via INTRAVENOUS
  Administered 2022-12-01: 100 mg via INTRAVENOUS

## 2022-12-01 MED ORDER — MIRTAZAPINE 7.5 MG PO TABS
7.5000 mg | ORAL_TABLET | Freq: Every day | ORAL | Status: DC
  Filled 2022-12-01: qty 1

## 2022-12-01 MED ORDER — SODIUM CHLORIDE 0.9 % IV SOLN: INTRAVENOUS | Status: DC

## 2022-12-01 MED ORDER — 0.9 % SODIUM CHLORIDE (POUR BTL) OPTIME
TOPICAL | Status: DC | PRN
  Administered 2022-12-01: 500 mL

## 2022-12-01 MED ORDER — PHENYLEPHRINE 80 MCG/ML (10ML) SYRINGE FOR IV PUSH (FOR BLOOD PRESSURE SUPPORT)
PREFILLED_SYRINGE | INTRAVENOUS | Status: DC | PRN
  Administered 2022-12-01: 160 ug via INTRAVENOUS
  Administered 2022-12-01: 320 ug via INTRAVENOUS

## 2022-12-01 MED ORDER — ACETAMINOPHEN 500 MG PO TABS
500.0000 mg | ORAL_TABLET | Freq: Four times a day (QID) | ORAL | Status: AC
  Administered 2022-12-02 (×2): 500 mg via ORAL
  Filled 2022-12-01 (×2): qty 1

## 2022-12-01 MED ORDER — BUPIVACAINE-EPINEPHRINE (PF) 0.5% -1:200000 IJ SOLN
INTRAMUSCULAR | Status: DC | PRN
  Administered 2022-12-01: 30 mL

## 2022-12-01 MED ORDER — CHLORHEXIDINE GLUCONATE 0.12 % MT SOLN: 15.0000 mL | Freq: Once | OROMUCOSAL | Status: AC

## 2022-12-01 MED ORDER — BISACODYL 10 MG RE SUPP: 10.0000 mg | Freq: Every day | RECTAL | Status: DC | PRN

## 2022-12-01 MED ORDER — FENTANYL CITRATE (PF) 100 MCG/2ML IJ SOLN: 25.0000 ug | INTRAMUSCULAR | Status: DC | PRN

## 2022-12-01 MED ORDER — MIDAZOLAM HCL 5 MG/5ML IJ SOLN
INTRAMUSCULAR | Status: DC | PRN
  Administered 2022-12-01: 2 mg via INTRAVENOUS

## 2022-12-01 MED ORDER — DIPHENHYDRAMINE HCL 12.5 MG/5ML PO ELIX: 12.5000 mg | ORAL_SOLUTION | ORAL | Status: DC | PRN

## 2022-12-01 MED ORDER — KETOROLAC TROMETHAMINE 30 MG/ML IJ SOLN
INTRAMUSCULAR | Status: AC
  Administered 2022-12-01: 30 mg via INTRAVENOUS
  Filled 2022-12-01: qty 1

## 2022-12-01 MED ORDER — METOCLOPRAMIDE HCL 5 MG/ML IJ SOLN: 5.0000 mg | Freq: Three times a day (TID) | INTRAMUSCULAR | Status: DC | PRN

## 2022-12-01 SURGICAL SUPPLY — 50 items
BIT DRILL 4.3MMS DISTAL GRDTED (BIT) IMPLANT
BIT DRILL LAG SCREW (DRILL) IMPLANT
BNDG COHESIVE 4X5 TAN STRL LF (GAUZE/BANDAGES/DRESSINGS) ×1 IMPLANT
BNDG COHESIVE 6X5 TAN ST LF (GAUZE/BANDAGES/DRESSINGS) ×1 IMPLANT
CHLORAPREP W/TINT 26 (MISCELLANEOUS) ×2 IMPLANT
CORTICAL BONE SCR 5.0MM X 46MM (Screw) ×1 IMPLANT
DRAPE 3/4 80X56 (DRAPES) ×1 IMPLANT
DRAPE C-ARMOR (DRAPES) ×1 IMPLANT
DRAPE INCISE IOBAN 66X45 STRL (DRAPES) IMPLANT
DRILL 4.3MMS DISTAL GRADUATED (BIT) ×1
DRILL LAG SCREW (DRILL) ×1
DRSG MEPILEX SACRM 8.7X9.8 (GAUZE/BANDAGES/DRESSINGS) ×1 IMPLANT
DRSG OPSITE POSTOP 3X4 (GAUZE/BANDAGES/DRESSINGS) IMPLANT
DRSG OPSITE POSTOP 4X6 (GAUZE/BANDAGES/DRESSINGS) IMPLANT
ELECT CAUTERY BLADE 6.4 (BLADE) ×1 IMPLANT
ELECT REM PT RETURN 9FT ADLT (ELECTROSURGICAL) ×1
ELECTRODE REM PT RTRN 9FT ADLT (ELECTROSURGICAL) ×1 IMPLANT
GAUZE SPONGE 4X4 12PLY STRL (GAUZE/BANDAGES/DRESSINGS) ×1 IMPLANT
GAUZE XEROFORM 1X8 LF (GAUZE/BANDAGES/DRESSINGS) IMPLANT
GLOVE BIO SURGEON STRL SZ8 (GLOVE) ×2 IMPLANT
GLOVE SURG UNDER LTX SZ8 (GLOVE) ×1 IMPLANT
GOWN STRL REUS W/ TWL LRG LVL3 (GOWN DISPOSABLE) ×1 IMPLANT
GOWN STRL REUS W/ TWL XL LVL3 (GOWN DISPOSABLE) ×1 IMPLANT
GOWN STRL REUS W/TWL LRG LVL3 (GOWN DISPOSABLE) ×1
GOWN STRL REUS W/TWL XL LVL3 (GOWN DISPOSABLE) ×1
GUIDEPIN VERSANAIL DSP 3.2X444 (ORTHOPEDIC DISPOSABLE SUPPLIES) IMPLANT
GUIDEWIRE BALL NOSE 100CM (WIRE) IMPLANT
HANDLE YANKAUER SUCT OPEN TIP (MISCELLANEOUS) ×1 IMPLANT
HFN LH 130 DEG 11MM X 440MM (Nail) IMPLANT
HIP FR NAIL LAG SCREW 10.5X110 (Orthopedic Implant) ×1 IMPLANT
MANIFOLD NEPTUNE II (INSTRUMENTS) ×1 IMPLANT
MAT ABSORB  FLUID 56X50 GRAY (MISCELLANEOUS) ×1
MAT ABSORB FLUID 56X50 GRAY (MISCELLANEOUS) ×1 IMPLANT
NDL FILTER BLUNT 18X1 1/2 (NEEDLE) ×1 IMPLANT
NEEDLE FILTER BLUNT 18X1 1/2 (NEEDLE) ×1 IMPLANT
NEEDLE HYPO 22GX1.5 SAFETY (NEEDLE) ×1 IMPLANT
NS IRRIG 500ML POUR BTL (IV SOLUTION) ×1 IMPLANT
PACK HIP COMPR (MISCELLANEOUS) ×1 IMPLANT
SCREW CORTICL BON 5.0MM X 46MM (Screw) IMPLANT
SCREW LAG HIP FR NAIL 10.5X110 (Orthopedic Implant) IMPLANT
STAPLER SKIN PROX 35W (STAPLE) ×1 IMPLANT
STRAP SAFETY 5IN WIDE (MISCELLANEOUS) ×1 IMPLANT
SUT VIC AB 0 CT1 36 (SUTURE) ×1 IMPLANT
SUT VIC AB 1 CT1 36 (SUTURE) ×1 IMPLANT
SUT VIC AB 2-0 CT1 (SUTURE) ×2 IMPLANT
SYR 10ML LL (SYRINGE) ×1 IMPLANT
SYR 30ML LL (SYRINGE) ×1 IMPLANT
TAPE MICROFOAM 4IN (TAPE) ×1 IMPLANT
TRAP FLUID SMOKE EVACUATOR (MISCELLANEOUS) ×1 IMPLANT
WATER STERILE IRR 500ML POUR (IV SOLUTION) ×1 IMPLANT

## 2022-12-01 NOTE — Assessment & Plan Note (Signed)
Pain control

## 2022-12-01 NOTE — TOC Progression Note (Signed)
Transition of Care [place]) - Progression Note    Patient Details  Name: Bradley Bush MRN: [id number] Date of Birth: 06/05/1986  Transition of Care [facility]) CM/SW [place], RN Phone Number: 12/01/2022, 9:19 AM  Clinical Narrative:    Patient to have surgery later today, TOC to follow for needs   Expected Discharge Plan: Home/Self Care Barriers to Discharge: No Barriers Identified, Continued Medical Work up  Expected Discharge Plan and Services                                               Social Determinants of Health (SDOH) Interventions SDOH Screenings   Food Insecurity: No Food Insecurity (12/01/2022)  Housing: Low Risk  (12/01/2022)  Transportation Needs: No Transportation Needs (12/01/2022)  Utilities: Not At Risk (12/01/2022)  Depression (PHQ2-9): High Risk (11/24/2022)  Tobacco Use: High Risk (12/01/2022)    Readmission Risk Interventions     No data to display

## 2022-12-01 NOTE — Assessment & Plan Note (Addendum)
Patient advised not to use Suboxone while taking the Norco.  Will need to be off [place] for at least 12 hours before restarting the Suboxone.
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
## 2022-12-01 NOTE — Anesthesia Preprocedure Evaluation (Signed)
Anesthesia Evaluation  Patient identified by MRN, date of birth, ID band Patient awake    Reviewed: Allergy & Precautions, H&P , NPO status , Patient's Chart, lab work & pertinent test results, reviewed documented beta blocker date and time   History of Anesthesia Complications Negative for: history of anesthetic complications  Airway Mallampati: III  TM Distance: >3 FB Neck ROM: full    Dental  (+) Dental Advidsory Given, Poor Dentition   Pulmonary neg shortness of breath, neg sleep apnea, neg COPD, neg recent URI, Current Smoker   Pulmonary exam normal breath sounds clear to auscultation       Cardiovascular Exercise Tolerance: Good negative cardio ROS Normal cardiovascular exam Rhythm:regular Rate:Normal     Neuro/Psych  PSYCHIATRIC DISORDERS Anxiety     negative neurological ROS     GI/Hepatic negative GI ROS,,,(+) Hepatitis -, C  Endo/Other  negative endocrine ROS    Renal/GU negative Renal ROS  negative genitourinary   Musculoskeletal   Abdominal   Peds  Hematology negative hematology ROS (+)   Anesthesia Other Findings Past Medical History: No date: Drug abuse ([facility]) No date: Hepatitis C   Reproductive/Obstetrics negative OB ROS                             Anesthesia Physical Anesthesia Plan  ASA: 2  Anesthesia Plan: Spinal   Post-op Pain Management:    Induction: Intravenous  PONV Risk Score and Plan: Propofol infusion and TIVA  Airway Management Planned: Natural Airway and Simple Face Mask  Additional Equipment:   Intra-op Plan:   Post-operative Plan:   Informed Consent: I have reviewed the patients History and Physical, chart, labs and discussed the procedure including the risks, benefits and alternatives for the proposed anesthesia with the patient or authorized representative who has indicated his/her understanding and acceptance.     Dental Advisory  Given  Plan Discussed with: Anesthesiologist, CRNA and Surgeon  Anesthesia Plan Comments:        Anesthesia Quick Evaluation

## 2022-12-01 NOTE — Assessment & Plan Note (Addendum)
Recommend following up with gastroenterology as outpatient.

## 2022-12-01 NOTE — Assessment & Plan Note (Signed)
Nicotine patch  
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
## 2022-12-01 NOTE — Progress Notes (Signed)
Bedside admission preformed with primary RN Curt Bears and Probation officer. Pt A&Ox4 and in NAD at time of assessment. Pt verbalized refusal of continuous pulse oximetry monitoring but agree's to spot checked oximetry with vitals at this time. Pt educated on purpose of checking o2 and is calm and cooperative with staff. Pt verbalized understanding of such. Orders reflect pt's verbal request at this time.
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
## 2022-12-01 NOTE — Assessment & Plan Note (Deleted)
Hepatitis A positive Patient tested positive for hepatitis A IgG plus IgM antihepatitis C antibody on 11/25/2022 Has appointment to see ID on 2/22 Consider inpatient ID consult

## 2022-12-01 NOTE — Anesthesia Procedure Notes (Signed)
Spinal  Patient location during procedure: OR Start time: 12/01/2022 2:26 PM End time: 12/01/2022 2:34 PM Reason for block: surgical anesthesia Staffing Performed: resident/CRNA  Anesthesiologist: Martha Clan, MD Resident/CRNA: Otho Perl, CRNA Performed by: Otho Perl, CRNA Authorized by: Martha Clan, MD   Preanesthetic Checklist Completed: patient identified, IV checked, site marked, risks and benefits discussed, surgical consent, monitors and equipment checked, pre-op evaluation and timeout performed Spinal Block Patient position: sitting Prep: ChloraPrep Patient monitoring: heart rate, continuous pulse ox, blood pressure and cardiac monitor Approach: midline Location: L4-5 Injection technique: single-shot Needle Needle type: Whitacre and Introducer  Needle gauge: 24 G Needle length: 9 cm Assessment Events: CSF return Additional Notes Negative paresthesia. Negative blood return. Positive free-flowing CSF. Expiration date of kit checked and confirmed. Patient tolerated procedure well, without complications.
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
## 2022-12-01 NOTE — Transfer of Care (Signed)
Immediate Anesthesia Transfer of Care Note  Patient: Bradley Bush  Procedure(s) Performed: INTRAMEDULLARY (IM) NAIL INTERTROCHANTERIC (Left: Hip)  Patient Location: PACU  Anesthesia Type:MAC  Level of Consciousness: awake  Airway & Oxygen Therapy: Patient Spontanous Breathing and Patient connected to face mask oxygen  Post-op Assessment: Report given to RN and Post -op Vital signs reviewed and stable  Post vital signs: Reviewed  Last Vitals:  Vitals Value Taken Time  BP 100/57 12/01/22 1621  Temp 42F   Pulse 76 12/01/22 1624  Resp 12 12/01/22 1624  SpO2 99 % 12/01/22 1624  Vitals shown include unvalidated device data.  Last Pain:  Vitals:   12/01/22 1209  TempSrc: Temporal  PainSc: 10-Worst pain ever         Complications: No notable events documented.

## 2022-12-01 NOTE — Plan of Care (Signed)

## 2022-12-01 NOTE — Assessment & Plan Note (Signed)
No recent use

## 2022-12-01 NOTE — Anesthesia Procedure Notes (Signed)
Procedure Name: LMA Insertion Date/Time: 12/01/2022 2:48 PM  Performed by: Otho Perl, CRNAPre-anesthesia Checklist: Patient identified, Patient being monitored, Timeout performed, Emergency Drugs available and Suction available Patient Re-evaluated:Patient Re-evaluated prior to induction Oxygen Delivery Method: Circle system utilized Preoxygenation: Pre-oxygenation with 100% oxygen Induction Type: IV induction Ventilation: Mask ventilation without difficulty LMA: LMA inserted LMA Size: 4.0 Tube type: Oral Number of attempts: 1 Placement Confirmation: positive ETCO2 and breath sounds checked- equal and bilateral Tube secured with: Tape Dental Injury: Teeth and Oropharynx as per pre-operative assessment

## 2022-12-02 ENCOUNTER — Encounter: Payer: Self-pay | Admitting: Surgery

## 2022-12-02 DIAGNOSIS — S72002A Fracture of unspecified part of neck of left femur, initial encounter for closed fracture: Secondary | ICD-10-CM | POA: Diagnosis not present

## 2022-12-02 MED ORDER — HYDROCODONE-ACETAMINOPHEN 5-325 MG PO TABS: 1.0000 | ORAL_TABLET | Freq: Four times a day (QID) | ORAL | 0 refills | Status: DC | PRN

## 2022-12-02 MED ORDER — ASPIRIN 325 MG PO TBEC: 325.0000 mg | DELAYED_RELEASE_TABLET | Freq: Two times a day (BID) | ORAL | 0 refills | Status: DC

## 2022-12-02 MED ORDER — ENOXAPARIN SODIUM 40 MG/0.4ML IJ SOSY: 40.0000 mg | PREFILLED_SYRINGE | INTRAMUSCULAR | 0 refills | Status: DC
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
## 2022-12-02 NOTE — Anesthesia Postprocedure Evaluation (Signed)
Anesthesia Post Note  Patient: Bradley Bush  Procedure(s) Performed: INTRAMEDULLARY (IM) NAIL INTERTROCHANTERIC (Left: Hip)  Patient location during evaluation: Nursing Unit Anesthesia Type: Spinal Level of consciousness: oriented and awake and alert Pain management: pain level controlled Vital Signs Assessment: post-procedure vital signs reviewed and stable Respiratory status: spontaneous breathing and respiratory function stable Cardiovascular status: blood pressure returned to baseline and stable Postop Assessment: no headache, no backache, no apparent nausea or vomiting and patient able to bend at knees Anesthetic complications: no   No notable events documented.   Last Vitals:  Vitals:   12/01/22 2346 12/02/22 0418  BP: 110/66 106/60  Pulse: 74 65  Resp: 18 20  Temp: 36.8 C 36.6 C  SpO2: 99% 97%    Last Pain:  Vitals:   12/02/22 0757  TempSrc:   PainSc: [place]
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
## 2022-12-02 NOTE — TOC Progression Note (Signed)
Transition of Care [facility]) - Progression Note    Patient Details  Name: Bradley Bush MRN: [id number] Date of Birth: 03-27-1986  Transition of Care [facility]) CM/SW [place], RN Phone Number: 12/02/2022, 11:57 AM  Clinical Narrative:    Requested a tall rolling walker to be delivered to the patient at the bedside prior to dc by adapt He lives at home with his parents Independent at baseline, plans to go to Outpatient PT  Expected Discharge Plan: Home/Self Care Barriers to Discharge: No Barriers Identified, Continued Medical Work up  Expected Discharge Plan and Services                                               Social Determinants of Health (SDOH) Interventions SDOH Screenings   Food Insecurity: No Food Insecurity (12/01/2022)  Housing: Low Risk  (12/01/2022)  Transportation Needs: No Transportation Needs (12/01/2022)  Utilities: Not At Risk (12/01/2022)  Depression (PHQ2-9): High Risk (11/24/2022)  Tobacco Use: High Risk (12/02/2022)    Readmission Risk Interventions     No data to display
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
## 2022-12-03 DIAGNOSIS — Z8619 Personal history of other infectious and parasitic diseases: Secondary | ICD-10-CM

## 2022-12-03 DIAGNOSIS — F1920 Other psychoactive substance dependence, uncomplicated: Secondary | ICD-10-CM

## 2022-12-03 DIAGNOSIS — S72002A Fracture of unspecified part of neck of left femur, initial encounter for closed fracture: Secondary | ICD-10-CM | POA: Diagnosis not present

## 2022-12-03 DIAGNOSIS — B159 Hepatitis A without hepatic coma: Secondary | ICD-10-CM
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
## 2022-12-03 NOTE — Progress Notes (Addendum)
     [facility] RE[facility]ERR[facility]        Occupational Therapy * Physical Therapy * Speech Therapy                           DATE  PATIENT NAME   PATIENT MRN        DIAGNOSIS/DIAGNOSIS CODE   DATE OF DISCHARGE:        PRIMARY CARE PHYSICIAN      PCP PHONE/FAX      Dear Provider (Name: [facility] __  Fax: [phone]   I certify that I have examined this patient and that occupational/physical/speech therapy is necessary on an outpatient basis.    The patient has expressed interest in completing their recommended course of therapy at your  location.  Once a formal order from the patient's primary care physician has been obtained, please  contact him/her to schedule an appointment for evaluation at your earliest convenience.   [ x]  Physical Therapy Evaluate and Treat  [  ]  Occupational Therapy Evaluate and Treat  [  ]  Speech Therapy Evaluate and Treat         The patient's primary care physician (listed above) must furnish and be responsible for a formal order such that the recommended services may be furnished while under the primary physician's care, and that the plan of care will be established and reviewed every 30 days (or more often if condition necessitates).
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
## 2022-12-03 NOTE — TOC Progression Note (Signed)
Transition of Care [facility]) - Progression Note    Patient Details  Name: Bradley Bush MRN: [id number] Date of Birth: Apr 11, 1986  Transition of Care [facility]) CM/SW [place], RN Phone Number: 12/03/2022, 11:01 AM  Clinical Narrative:     Faxed outpatient referral to [facility] pT [phone]  Expected Discharge Plan: Home/Self Care Barriers to Discharge: No Barriers Identified, Continued Medical Work up  Expected Discharge Plan and Services         Expected Discharge Date: 12/03/22                                     Social Determinants of Health (SDOH) Interventions SDOH Screenings   Food Insecurity: No Food Insecurity (12/01/2022)  Housing: Low Risk  (12/01/2022)  Transportation Needs: No Transportation Needs (12/01/2022)  Utilities: Not At Risk (12/01/2022)  Depression (PHQ2-9): High Risk (11/24/2022)  Tobacco Use: High Risk (12/02/2022)    Readmission Risk Interventions     No data to display

## 2022-12-03 NOTE — Plan of Care (Signed)
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
## 2022-12-03 NOTE — Discharge Instructions (Signed)

## 2022-12-03 NOTE — Assessment & Plan Note (Signed)
Positive test on 11/25/22

## 2022-12-06 ENCOUNTER — Telehealth: Payer: Self-pay

## 2022-12-06 NOTE — Transitions of Care (Post Inpatient/ED Visit) (Signed)
   12/06/2022  Name: Bradley Bush MRN: [id number] DOB: 02-21-1986  Today's TOC FU Call Status: Today's TOC FU Call Status:: Successful TOC FU Call Competed TOC FU Call Complete Date: 12/06/22  Transition Care Management Follow-up Telephone Call Date of Discharge: 12/03/22  Items Reviewed: Did you receive and understand the discharge instructions provided?: Yes Medications obtained and verified?: Yes (Medications Reviewed) Any new allergies since your discharge?: Yes Dietary orders reviewed?: Yes Do you have support at home?: Yes People in Home: parent(s)  Home Care and Equipment/Supplies: [place] Ordered?: No Any new equipment or medical supplies ordered?: No  Functional Questionnaire: Do you need assistance with bathing/showering or dressing?: No Do you need assistance with meal preparation?: No Do you need assistance with eating?: No Do you have difficulty maintaining continence: No Do you need assistance with getting out of bed/getting out of a chair/moving?: No Do you have difficulty managing or taking your medications?: No  Folllow up appointments reviewed: PCP Follow-up appointment confirmed?: Yes Date of PCP follow-up appointment?: 12/08/22 Follow-up Provider: [facility] Follow-up appointment confirmed?: No Reason Specialist Follow-Up Not Confirmed: Patient has Specialist Provider Number and will Call for Appointment Do you need transportation to your follow-up appointment?: No Do you understand care options if your condition(s) worsen?: Yes-patient verbalized understanding    Bradley Bush, [place] Direct Dial [phone]
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
## 2022-12-08 ENCOUNTER — Ambulatory Visit (INDEPENDENT_AMBULATORY_CARE_PROVIDER_SITE_OTHER): Payer: 59 | Admitting: Physician Assistant

## 2022-12-08 ENCOUNTER — Encounter: Payer: Self-pay | Admitting: Physician Assistant

## 2022-12-08 VITALS — BP 123/82 | HR 87 | Ht 77.0 in | Wt 177.2 lb

## 2022-12-08 DIAGNOSIS — B1920 Unspecified viral hepatitis C without hepatic coma: Secondary | ICD-10-CM | POA: Diagnosis not present

## 2022-12-08 DIAGNOSIS — S72002A Fracture of unspecified part of neck of left femur, initial encounter for closed fracture: Secondary | ICD-10-CM | POA: Diagnosis not present

## 2022-12-08 DIAGNOSIS — Z09 Encounter for follow-up examination after completed treatment for conditions other than malignant neoplasm: Secondary | ICD-10-CM

## 2022-12-08 DIAGNOSIS — F1920 Other psychoactive substance dependence, uncomplicated: Secondary | ICD-10-CM

## 2022-12-08 DIAGNOSIS — S72002S Fracture of unspecified part of neck of left femur, sequela: Secondary | ICD-10-CM | POA: Diagnosis not present

## 2022-12-09 ENCOUNTER — Telehealth: Payer: Self-pay

## 2022-12-09 NOTE — Telephone Encounter (Signed)
Copied from [place]. Topic: Referral - Question >> Dec 09, 2022  3:24 PM Penni Bombard wrote: Reason for CRM: Fara Olden with [facility] called saying they do not take [facility].   They got a referral yesterday for PT

## 2022-12-10 ENCOUNTER — Telehealth (HOSPITAL_COMMUNITY): Payer: Self-pay | Admitting: Licensed Clinical Social Worker

## 2022-12-10 DIAGNOSIS — Z09 Encounter for follow-up examination after completed treatment for conditions other than malignant neoplasm: Secondary | ICD-10-CM | POA: Insufficient documentation

## 2022-12-10 DIAGNOSIS — S72002A Fracture of unspecified part of neck of left femur, initial encounter for closed fracture: Secondary | ICD-10-CM | POA: Insufficient documentation

## 2022-12-10 NOTE — Telephone Encounter (Addendum)
The therapist contacts [name] regarding a referral received from Ms. Valentino Saxon. Bradley Bush says that he was not really clear to what Ms. Oswalt was referring him. The therapist confirms his identity via two identifiers. He says that he completed the SA IOP at [facility] and stopped participating in the stepdown program as the person with whom he was working was taking him to one extra meeting per week; whereas, he says that other people got to sit down and talk with their assigned person. After some discussion, it becomes apparent that Bradley Bush was assigned to a Transport planner and not a therapist. He says that he used to get his Suboxone from [facility] but now gets it from [facility] He says that he is involved in Twelve Step meetings, has a Publishing copy, and does service work but would be interested in seeing a therapist but lives in [place] and has no transportation. The therapist informs him that [facility] has an office in [place] and he notes that he would like to see about seeing a therapist there and "giving it a try." The therapist will ask the Referral Coordinator to have someone from this office reach out to him about scheduling.    Adam Phenix, [place], LCSW, [facility], [place] 12/10/2022

## 2022-12-16 ENCOUNTER — Other Ambulatory Visit
Admission: RE | Admit: 2022-12-16 | Discharge: 2022-12-16 | Disposition: A | Discharge status: Home / Self Care | Payer: Medicaid Other | Attending: Infectious Diseases | Admitting: Infectious Diseases

## 2022-12-16 ENCOUNTER — Ambulatory Visit: Payer: Medicaid Other | Attending: Infectious Diseases | Admitting: Infectious Diseases

## 2022-12-16 ENCOUNTER — Encounter: Payer: Self-pay | Admitting: Infectious Diseases

## 2022-12-16 VITALS — BP 103/56 | HR 105 | Temp 97.6°F | Ht 77.0 in | Wt 184.0 lb

## 2022-12-16 DIAGNOSIS — F1921 Other psychoactive substance dependence, in remission: Secondary | ICD-10-CM | POA: Diagnosis not present

## 2022-12-16 DIAGNOSIS — B182 Chronic viral hepatitis C: Secondary | ICD-10-CM | POA: Diagnosis present

## 2022-12-16 DIAGNOSIS — Z79899 Other long term (current) drug therapy: Secondary | ICD-10-CM | POA: Diagnosis not present

## 2022-12-16 DIAGNOSIS — Z87891 Personal history of nicotine dependence: Secondary | ICD-10-CM | POA: Insufficient documentation

## 2022-12-16 LAB — HEPATITIS A ANTIBODY, TOTAL: hep A Total Ab: REACTIVE — AB

## 2022-12-16 LAB — APTT: aPTT: 29 seconds (ref 24–36)

## 2022-12-16 LAB — PROTIME-INR
INR: 1.1 (ref 0.8–1.2)
Prothrombin Time: 14 seconds (ref 11.4–15.2)

## 2022-12-16 LAB — TSH: TSH: 0.319 u[IU]/mL — ABNORMAL LOW (ref 0.350–4.500)

## 2022-12-16 LAB — HEPATITIS B SURFACE ANTIGEN: Hepatitis B Surface Ag: NONREACTIVE
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
## 2022-12-17 LAB — HCV RNA QUANT
HCV Quantitative Log: 6.698 log10 IU/mL (ref >1.70)
HCV Quantitative: 4990000 IU/mL (ref >50)

## 2022-12-17 LAB — RPR: RPR Ser Ql: NONREACTIVE

## 2022-12-18 LAB — HCV FIBROSURE
ALPHA 2-MACROGLOBULINS, QN: 134 mg/dL (ref 110–276)
ALT (SGPT) P5P: 22 IU/L (ref 0–55)
Apolipoprotein A-1: 105 mg/dL (ref 101–178)
Bilirubin, Total: 0.3 mg/dL (ref 0.0–1.2)
Fibrosis Score: 0.09 (ref 0.00–0.21)
GGT: 20 IU/L (ref 0–65)
Haptoglobin: 115 mg/dL (ref 17–317)
Necroinflammat Activity Score: 0.07 (ref 0.00–0.17)

## 2022-12-18 LAB — HEPATITIS B SURFACE ANTIBODY, QUANTITATIVE: Hep B S AB Quant (Post): 14.4 m[IU]/mL (ref >9.9)

## 2022-12-18 LAB — AFP TUMOR MARKER: AFP, Serum, Tumor Marker: <1.8 ng/mL (ref 0.0–6.9)

## 2022-12-18 LAB — HEPATITIS C GENOTYPE

## 2022-12-22 ENCOUNTER — Ambulatory Visit
Admission: RE | Admit: 2022-12-22 | Discharge: 2022-12-22 | Disposition: A | Discharge status: Home / Self Care | Payer: Medicaid Other | Source: Ambulatory Visit | Attending: Infectious Diseases | Admitting: Infectious Diseases

## 2022-12-22 DIAGNOSIS — B182 Chronic viral hepatitis C: Secondary | ICD-10-CM | POA: Insufficient documentation

## 2022-12-30 ENCOUNTER — Ambulatory Visit: Payer: Medicaid Other | Attending: Infectious Diseases | Admitting: Infectious Diseases

## 2022-12-30 ENCOUNTER — Encounter: Payer: Self-pay | Admitting: Infectious Diseases

## 2022-12-30 ENCOUNTER — Ambulatory Visit: Payer: Self-pay | Admitting: Physician Assistant

## 2022-12-30 ENCOUNTER — Other Ambulatory Visit (HOSPITAL_COMMUNITY): Payer: Self-pay

## 2022-12-30 DIAGNOSIS — B1920 Unspecified viral hepatitis C without hepatic coma: Secondary | ICD-10-CM | POA: Insufficient documentation

## 2022-12-30 DIAGNOSIS — B182 Chronic viral hepatitis C: Secondary | ICD-10-CM

## 2022-12-30 NOTE — Progress Notes (Signed)
The purpose of this virtual visit is to provide medical care while limiting exposure to the novel coronavirus (COVID19) for both patient and office staff.   Consent was obtained for phone visit:  Yes.   Answered questions that patient had about telehealth interaction:  Yes.   I discussed the limitations, risks, security and privacy concerns of performing an evaluation and management service by telephone. I also discussed with the patient that there may be a patient responsible charge related to this service. The patient expressed understanding and agreed to proceed.   Patient Location: Home Provider Location:office Patient, CMA and provider on this call  This visit was to discuss hepatitis C labs and to assess readiness for treatment Tests result DATE comment  HEPC RNA 4,990,000    HEPC  Genotype Ia    Hepatitis B profile SAG negative Core antibody negative SAB 14.4 12/16/2022 Indicates an of vaccination  Hepatitis A status Reactive    PT/PTT 1.1/29    AST, ALT, Bilirubin 41, 33, 0.3    Albumin 4.4    creatinine 1.02    HB/Platelet 14.8, 211    HIV Nonrate active    AFP Less than 1.8    TSH 0.319  Will get T4  comorbidities      tobacco     alcohol None    drug Clean since last year    APRI Score     FIB 4 score     Current meds Suboxone    Ultrasound Elastography Normal    High-pressure No fibrosis, no necroinflammatory activity          Impression/recommendation Hepatitis C with no evidence of decompensation or cirrhosis Patient is eligible to start Epclusa 1 tablet once a day for 12 weeks Discussed compliance, side effects, need for monitoring labs Information sent to [place] coordinated with pharmacists Patient will be contacted after prior authorization Total time spent on this call 13 minutes

## 2022-12-31 ENCOUNTER — Telehealth: Payer: Self-pay

## 2022-12-31 ENCOUNTER — Other Ambulatory Visit (HOSPITAL_COMMUNITY): Payer: Self-pay

## 2022-12-31 NOTE — Telephone Encounter (Signed)
[facility] Patient Advocate Encounter   Received notification from [facility] that prior authorization for [name] is required.   PA submitted on 12/31/22 Key [id number] Status is pending    [facility] will continue to follow.   Bradley Bush, [facility] Patient [facility] for Infectious Disease Phone: [phone] Fax:  [phone]

## 2023-01-03 ENCOUNTER — Other Ambulatory Visit (HOSPITAL_COMMUNITY): Payer: Self-pay

## 2023-01-03 ENCOUNTER — Telehealth: Payer: Self-pay

## 2023-01-03 NOTE — Telephone Encounter (Addendum)
[facility] Patient Advocate Encounter  Prior Authorization for [employer]) has been approved.    PA# [id number] CC Effective dates: 12/31/22 through 03/25/23  Prescription will need to be filled at [place].  Copay Card for Brand Name [facility] will continue to follow.  Ileene Patrick, [facility] Patient [facility] for Infectious Disease Phone: [phone] Fax:  [phone]

## 2023-01-04 ENCOUNTER — Ambulatory Visit: Payer: 59 | Attending: Physician Assistant | Admitting: Physical Therapy

## 2023-01-04 ENCOUNTER — Encounter: Payer: Self-pay | Admitting: Physical Therapy

## 2023-01-04 ENCOUNTER — Other Ambulatory Visit: Payer: Self-pay

## 2023-01-04 ENCOUNTER — Ambulatory Visit: Payer: Medicaid Other | Admitting: Physician Assistant

## 2023-01-04 DIAGNOSIS — M25552 Pain in left hip: Secondary | ICD-10-CM | POA: Insufficient documentation

## 2023-01-04 DIAGNOSIS — S72002S Fracture of unspecified part of neck of left femur, sequela: Secondary | ICD-10-CM | POA: Insufficient documentation

## 2023-01-04 DIAGNOSIS — Z0000 Encounter for general adult medical examination without abnormal findings: Secondary | ICD-10-CM

## 2023-01-04 NOTE — Telephone Encounter (Signed)
Opened in error
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
## 2023-01-11 ENCOUNTER — Telehealth: Payer: Self-pay | Admitting: Physical Therapy

## 2023-01-11 ENCOUNTER — Ambulatory Visit: Payer: 59 | Admitting: Physical Therapy

## 2023-01-11 NOTE — Telephone Encounter (Signed)
Called pt to inquire about absence. Did not reach so left VM instructing pt to call back to reschedule missed appointment.

## 2023-01-12 ENCOUNTER — Other Ambulatory Visit: Payer: Self-pay | Admitting: Pharmacist

## 2023-01-12 DIAGNOSIS — B182 Chronic viral hepatitis C: Secondary | ICD-10-CM

## 2023-01-12 MED ORDER — EPCLUSA 400-100 MG PO TABS: 1.0000 | ORAL_TABLET | Freq: Every day | ORAL | 2 refills | Status: DC

## 2023-01-13 ENCOUNTER — Encounter: Payer: Medicaid Other | Admitting: Physical Therapy

## 2023-01-14 ENCOUNTER — Telehealth: Payer: Self-pay | Admitting: Pharmacist

## 2023-01-14 NOTE — Telephone Encounter (Signed)
Patient is approved to receive Epclusa x 12 weeks for chronic Hepatitis C infection. Counseled patient to take Epclusa daily with or without food. Encouraged patient not to miss any doses and explained how their chance of cure could go down with each dose missed. Counseled patient on what to do if dose is missed - if it is closer to the missed dose take immediately; if closer to next dose skip dose and take the next dose at the usual time. Counseled patient on common side effects such as headache, fatigue, and nausea and that these normally decrease with time. I reviewed patient medications and found no drug interactions. Discussed with patient that there are several drug interactions including acid suppressants. Instructed patient to call clinic if he wishes to start a new medication during course of therapy. Also advised patient to call if he experiences any side effects. Patient will follow-up in one month with Dr. Delaine Lame; appointment needs to be scheduled.  Alfonse Spruce, PharmD, CPP, BCIDP, [place] Clinical Pharmacist Practitioner [facility]

## 2023-01-18 ENCOUNTER — Ambulatory Visit: Payer: 59 | Admitting: Physical Therapy

## 2023-01-18 ENCOUNTER — Telehealth: Payer: Self-pay | Admitting: Physical Therapy

## 2023-01-18 NOTE — Telephone Encounter (Signed)
Called pt to inquire about absence from physical therapy. Did not reach so left VM instructing patient to call back to reschedule and that he is now at two no shows and third one would result in removal from schedule.

## 2023-01-20 ENCOUNTER — Encounter: Payer: Medicaid Other | Admitting: Physical Therapy

## 2023-01-25 ENCOUNTER — Ambulatory Visit: Payer: Medicaid Other | Attending: Physician Assistant | Admitting: Physical Therapy

## 2023-01-25 DIAGNOSIS — M25552 Pain in left hip: Secondary | ICD-10-CM | POA: Insufficient documentation
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
## 2023-01-27 ENCOUNTER — Encounter: Payer: Medicaid Other | Admitting: Physical Therapy

## 2023-01-31 NOTE — Telephone Encounter (Signed)
Patient reports starting the Epclusa around 01/21/23. When would you like him to follow up. Kyrra Prada T Pricilla Loveless

## 2023-01-31 NOTE — Telephone Encounter (Signed)
PATIENT SCHEDULED  °

## 2023-02-01 ENCOUNTER — Ambulatory Visit: Payer: Medicaid Other | Admitting: Physical Therapy

## 2023-02-01 ENCOUNTER — Ambulatory Visit (INDEPENDENT_AMBULATORY_CARE_PROVIDER_SITE_OTHER): Payer: Medicaid Other | Admitting: Physician Assistant

## 2023-02-01 ENCOUNTER — Encounter: Payer: Self-pay | Admitting: Physician Assistant

## 2023-02-01 VITALS — BP 125/81 | HR 87 | Ht 77.0 in | Wt 179.4 lb

## 2023-02-01 DIAGNOSIS — F1920 Other psychoactive substance dependence, uncomplicated: Secondary | ICD-10-CM

## 2023-02-01 DIAGNOSIS — Z720 Tobacco use: Secondary | ICD-10-CM

## 2023-02-01 DIAGNOSIS — F419 Anxiety disorder, unspecified: Secondary | ICD-10-CM

## 2023-02-01 DIAGNOSIS — F32A Depression, unspecified: Secondary | ICD-10-CM

## 2023-02-01 DIAGNOSIS — B1920 Unspecified viral hepatitis C without hepatic coma: Secondary | ICD-10-CM | POA: Insufficient documentation

## 2023-02-01 DIAGNOSIS — Z23 Encounter for immunization: Secondary | ICD-10-CM

## 2023-02-01 MED ORDER — BUSPIRONE HCL 5 MG PO TABS: 5.0000 mg | ORAL_TABLET | Freq: Two times a day (BID) | ORAL | 0 refills | Status: DC

## 2023-02-01 NOTE — Progress Notes (Signed)
I,Sha'taria Tyson,acting as a Neurosurgeon for [employer], PA-C.,have documented all relevant documentation on the behalf of Debera Lat, PA-C,as directed by  [employer], PA-C while in the presence of [employer], PA-C.   Established patient visit   Patient: Bradley Bush   DOB: [birth year]   37 y.o. Male  MRN: [id number] Visit Date: 02/01/2023  Today's healthcare provider: Debera Lat, PA-C   CC: anxiety and depression FU  Subjective    HPI  Patient is a 37 year old male who presents requesting referral to [employer] [facility].    02/01/2023   10:44 AM 11/24/2022    1:57 PM  GAD 7 : Generalized Anxiety Score  Nervous, Anxious, on Edge 3 3  Control/stop worrying 3 3  Worry too much - different things 2 3  Trouble relaxing 2 0  Restless 2 3  Easily annoyed or irritable 2 1  Afraid - awful might happen 3 0  Total GAD 7 Score 17 13  Anxiety Difficulty  Not difficult at all        02/01/2023   10:44 AM 12/30/2022    8:27 AM 12/16/2022    8:54 AM  Depression screen PHQ 2/9  Decreased Interest 1 0 0  Down, Depressed, Hopeless 1 1 1   PHQ - 2 Score 2 1 1   Altered sleeping 2 0 0  Tired, decreased energy 1 0 0  Change in appetite 1 0 0  Feeling bad or failure about yourself  0 0 0  Trouble concentrating 2 0 0  Moving slowly or fidgety/restless 0 0 0  Suicidal thoughts 0 0 0  PHQ-9 Score 8 1 1      Medications: Outpatient Medications Prior to Visit  Medication Sig   Buprenorphine HCl-Naloxone HCl 8-2 MG FILM SMARTSIG:1 Strip(s) Sublingual Twice Daily   EPCLUSA 400-100 MG TABS Take 1 tablet by mouth daily.   aspirin EC 325 MG tablet Take 1 tablet (325 mg total) by mouth 2 (two) times daily. (Patient not taking: Reported on 01/04/2023)   No facility-administered medications prior to visit.    Review of Systems  All other systems reviewed and are negative. Except see HPI     Objective    BP 125/81   Pulse 87   Ht 6\' 5"  (1.956 m)   Wt 179 lb 6.4 oz (81.4 kg)   SpO2  98%   BMI 21.27 kg/m    Physical Exam Vitals reviewed.  Constitutional:      General: He is not in acute distress.    Appearance: Normal appearance. He is not diaphoretic.  HENT:     Head: Normocephalic and atraumatic.  Eyes:     General: No scleral icterus.    Conjunctiva/sclera: Conjunctivae normal.  Cardiovascular:     Rate and Rhythm: Normal rate and regular rhythm.     Pulses: Normal pulses.     Heart sounds: Normal heart sounds. No murmur heard. Pulmonary:     Effort: Pulmonary effort is normal. No respiratory distress.     Breath sounds: Normal breath sounds. No wheezing or rhonchi.  Musculoskeletal:     Cervical back: Neck supple.     Right lower leg: No edema.     Left lower leg: No edema.  Lymphadenopathy:     Cervical: No cervical adenopathy.  Skin:    General: Skin is warm and dry.     Findings: No rash.  Neurological:     Mental Status: He is alert and oriented to person,  place, and time. Mental status is at baseline.  Psychiatric:        Behavior: Behavior normal.        Thought Content: Thought content normal.        Judgment: Judgment normal.      No results found for any visits on 02/01/23.  Assessment & Plan     Anxiety and depression Chronic Phq9 score was 8 - mild depression and gad7 score was 17 - severe anxiety He has been having difficulties with his work, taking care of things at home or getting along with other people - Ambulatory referral to Psychiatry for management and counseling - busPIRone (BUSPAR) 5 MG tablet; Take 1 tablet (5 mg total) by mouth 2 (two) times daily.  Dispense: 60 tablet; Refill: 0 He just started work at [employer] but he has a lot of anxiety, see above Will assess in 6 weeks  Need for tetanus, diphtheria, and acellular pertussis (Tdap) vaccine in patient of adolescent age or older Pt has a shot when he was in prison but it could be more than 10 years ago as he was incarcerated a few times before. - Administer  Tetanus-diphtheria-acellular pertussis (Tdap) vaccine  Current every day nicotine vaping Pt has been vaping the same amount weekly Advised vaping cessation Pt was advised to: Call the quitline: 1-800-QUIT-NOW or visit How to Quit Smoking. Quitlines provide free and confidential coaching . Pt promised to cut down on frequency of vaping Amb ref to Psychiatry Reassess at FU  Hepatitis C virus infection without hepatic coma, unspecified chronicity Was diagnosed in 2022, was positive on 11/25/22 Asymptomatic Started his treatment 11 days ago Doing well on his medication Managed by ID Dr. 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  Continue daily AA/NA meetings and meetings with his sponsor. He is currently on step 3 of 12 steps program. Has a strong social support and advised to seek an urgent care at [facility] in [place]  Return in about 6 weeks (around 03/15/2023) for anxiety, depression FU.      The patient was advised to call back or seek an in-person evaluation if the symptoms worsen or if the condition fails to improve as anticipated.  I discussed the assessment and treatment plan with the patient. The patient was provided an opportunity to ask questions and all were answered. The patient agreed with the plan and demonstrated an understanding of the instructions.  I, Debera Lat, PA-C have reviewed all documentation for this visit. The documentation on 02/01/23 for the exam, diagnosis, procedures, and orders are all accurate and complete.  Debera Lat, [facility], MMS [facility] [phone] (phone) [phone] (fax)   [facility]

## 2023-02-03 ENCOUNTER — Encounter: Payer: Medicaid Other | Admitting: Physical Therapy

## 2023-02-08 ENCOUNTER — Encounter: Payer: Medicaid Other | Admitting: Physical Therapy

## 2023-02-09 ENCOUNTER — Ambulatory Visit (INDEPENDENT_AMBULATORY_CARE_PROVIDER_SITE_OTHER): Payer: Medicaid Other | Admitting: Physician Assistant

## 2023-02-09 ENCOUNTER — Encounter: Payer: Self-pay | Admitting: Physician Assistant

## 2023-02-09 VITALS — BP 120/72 | HR 60 | Ht 77.0 in | Wt 179.0 lb

## 2023-02-09 DIAGNOSIS — M545 Low back pain, unspecified: Secondary | ICD-10-CM

## 2023-02-09 DIAGNOSIS — G8929 Other chronic pain: Secondary | ICD-10-CM

## 2023-02-09 MED ORDER — MELOXICAM 7.5 MG PO TABS: 7.5000 mg | ORAL_TABLET | Freq: Every day | ORAL | 0 refills | Status: DC

## 2023-02-09 NOTE — Progress Notes (Signed)
Established patient visit   Patient: Bradley Bush   DOB: 1986-06-07   37 y.o. Male  MRN: [id number] Visit Date: 02/09/2023  Today's healthcare provider: Debera Lat, PA-C   CC: back pain x 2 days  Subjective     HPI   Recurrent problem---Severe back pain due to heavy lifting.-sharp pain worse when standing and stiff when sitting for long term.--2 days. Taking tylenol and meloxicam Last edited by Shelly Bombard, CMA on 02/09/2023  1:35 PM.      Medications: Outpatient Medications Prior to Visit  Medication Sig   aspirin EC 325 MG tablet Take 1 tablet (325 mg total) by mouth 2 (two) times daily.   Buprenorphine HCl-Naloxone HCl 8-2 MG FILM SMARTSIG:1 Strip(s) Sublingual Twice Daily   busPIRone (BUSPAR) 5 MG tablet Take 1 tablet (5 mg total) by mouth 2 (two) times daily.   EPCLUSA 400-100 MG TABS Take 1 tablet by mouth daily.   No facility-administered medications prior to visit.    Review of Systems  Musculoskeletal:  Positive for back pain.       Objective    BP 120/72 (BP Location: Right Arm, Patient Position: Sitting, Cuff Size: Normal)   Pulse 60   Ht  (1.956 m)   Wt 179 lb (81.2 kg)   SpO2 98%   BMI 21.23 kg/m    Physical Exam Vitals reviewed.  Constitutional:      General: He is not in acute distress.    Appearance: Normal appearance. He is not diaphoretic.  HENT:     Head: Normocephalic and atraumatic.  Eyes:     General: No scleral icterus.    Conjunctiva/sclera: Conjunctivae normal.  Cardiovascular:     Rate and Rhythm: Normal rate and regular rhythm.     Pulses: Normal pulses.     Heart sounds: Normal heart sounds. No murmur heard. Pulmonary:     Effort: Pulmonary effort is normal. No respiratory distress.     Breath sounds: Normal breath sounds. No wheezing or rhonchi.  Musculoskeletal:     Cervical back: Neck supple.     Right lower leg: No edema.     Left lower leg: No edema.  Lymphadenopathy:     Cervical: No cervical  adenopathy.  Skin:    General: Skin is warm and dry.     Findings: No rash.  Neurological:     Mental Status: He is alert and oriented to person, place, and time. Mental status is at baseline.  Psychiatric:        Mood and Affect: Mood normal.        Behavior: Behavior normal.      No results found for any visits on 02/09/23.  Assessment & Plan     Bilateral low back pain without sciatica Acute on chronic. Normal vitals Positive crossed SLT( 90% specificity) No red flags to warrant emergent imaging today.   Continue a trial of : - meloxicam (MOBIC) 7.5 MG tablet; Take 1 tablet (7.5 mg total) by mouth daily.  Dispense: 30 tablet; Refill: 0  Adding B vitamins (at least 50 mg of Vitamin B1 [thiamine ], 50 mg of vitamin B6 [pyridoxine ] and 2,000 ??g of B12 [cyanocobalamin ]) to NSAIDs reduced medication needs and pain scores compared to NSAIDs alone - Will consider a referral to PT if pain persist - Will add gabapentin if pain persist. Reassure patients that pain is usually self-limited; treatment should relieve pain and improve function. McKenzie  method is a reasonable approach for those patients who feel better with low back extension (standing up straight) Manual medicine, osteopathic manipulative treatments (OMTs): myofascial release, counterstrain, bilateral ligamentous techniques as well as muscle energy techniques as tolerated Encouraging activity as tolerated leads to quicker recovery; walk as much as possible.   No follow-ups on file.     The patient was advised to call back or seek an in-person evaluation if the symptoms worsen or if the condition fails to improve as anticipated.  I discussed the assessment and treatment plan with the patient. The patient was provided an opportunity to ask questions and all were answered. The patient agreed with the plan and demonstrated an understanding of the instructions.  I, Debera Lat, PA-C have reviewed all documentation for this  visit. The documentation on 02/09/23 for the exam, diagnosis, procedures, and orders are all accurate and complete.  Debera Lat, [facility], MMS [facility] [phone] (phone) [phone] (fax)    [facility]

## 2023-02-10 ENCOUNTER — Encounter: Payer: Medicaid Other | Admitting: Physical Therapy

## 2023-02-15 ENCOUNTER — Encounter: Payer: Medicaid Other | Admitting: Physical Therapy

## 2023-02-17 ENCOUNTER — Encounter: Payer: Medicaid Other | Admitting: Physical Therapy

## 2023-02-22 ENCOUNTER — Encounter: Payer: Medicaid Other | Admitting: Physical Therapy

## 2023-02-22 ENCOUNTER — Other Ambulatory Visit: Payer: Self-pay

## 2023-02-22 ENCOUNTER — Other Ambulatory Visit (HOSPITAL_COMMUNITY): Payer: Self-pay

## 2023-02-22 ENCOUNTER — Other Ambulatory Visit: Payer: Self-pay | Admitting: Pharmacist

## 2023-02-22 DIAGNOSIS — B182 Chronic viral hepatitis C: Secondary | ICD-10-CM

## 2023-02-22 MED ORDER — SOFOSBUVIR-VELPATASVIR 400-100 MG PO TABS
1.0000 | ORAL_TABLET | Freq: Every day | ORAL | 1 refills | Status: DC
  Filled 2023-02-22 (×2): qty 28, 28d supply, fill #0
  Filled 2023-03-18: qty 28, 28d supply, fill #1

## 2023-02-22 NOTE — Progress Notes (Signed)
Lupita Leash states patient no longer has [employer] and is only active with Medicaid now. He will have it filled and mailed from [facility]. Already received 1 month - sending 2 fills to complete a 24-month treatment.  Margarite Gouge, PharmD, CPP, BCIDP, AAHIVP Clinical Pharmacist Practitioner Infectious Diseases Clinical Pharmacist [facility]

## 2023-02-23 ENCOUNTER — Other Ambulatory Visit (HOSPITAL_COMMUNITY): Payer: Self-pay

## 2023-02-23 ENCOUNTER — Other Ambulatory Visit: Payer: Self-pay

## 2023-02-24 ENCOUNTER — Other Ambulatory Visit: Payer: Self-pay

## 2023-02-24 ENCOUNTER — Telehealth: Payer: Self-pay

## 2023-02-24 NOTE — Telephone Encounter (Signed)
[facility] Patient Advocate Encounter  Patient's medications have been couriered to [facility] from [facility] and will be picked up 02/24/23.  2nd Epclusa box.  Patient received his 1st box from [facility] because he had that insurance. Patient now have MEDICAID and will be getting his last 2 fills from [facility].  Clearance Coots , CPhT Specialty Pharmacy Patient [facility] for Infectious Disease Phone: [phone] Fax:  [phone]

## 2023-03-01 ENCOUNTER — Encounter: Payer: Self-pay | Admitting: Infectious Diseases

## 2023-03-01 ENCOUNTER — Other Ambulatory Visit
Admission: RE | Admit: 2023-03-01 | Discharge: 2023-03-01 | Disposition: A | Discharge status: Home / Self Care | Payer: Medicaid Other | Source: Ambulatory Visit | Attending: Infectious Diseases | Admitting: Infectious Diseases

## 2023-03-01 ENCOUNTER — Ambulatory Visit: Payer: Medicaid Other | Attending: Infectious Diseases | Admitting: Infectious Diseases

## 2023-03-01 VITALS — BP 116/71 | HR 67 | Temp 97.5°F | Ht 77.0 in | Wt 180.0 lb

## 2023-03-01 DIAGNOSIS — Z7982 Long term (current) use of aspirin: Secondary | ICD-10-CM | POA: Diagnosis not present

## 2023-03-01 DIAGNOSIS — Z79899 Other long term (current) drug therapy: Secondary | ICD-10-CM | POA: Insufficient documentation

## 2023-03-01 DIAGNOSIS — B182 Chronic viral hepatitis C: Secondary | ICD-10-CM | POA: Diagnosis present

## 2023-03-01 DIAGNOSIS — F32A Depression, unspecified: Secondary | ICD-10-CM | POA: Diagnosis present

## 2023-03-01 DIAGNOSIS — Z87891 Personal history of nicotine dependence: Secondary | ICD-10-CM | POA: Diagnosis not present

## 2023-03-01 DIAGNOSIS — Z791 Long term (current) use of non-steroidal anti-inflammatories (NSAID): Secondary | ICD-10-CM | POA: Insufficient documentation

## 2023-03-01 DIAGNOSIS — F419 Anxiety disorder, unspecified: Secondary | ICD-10-CM | POA: Insufficient documentation

## 2023-03-01 LAB — HEPATIC FUNCTION PANEL
ALT: 12 U/L (ref 0–44)
AST: 20 U/L (ref 15–41)
Albumin: 3.8 g/dL (ref 3.5–5.0)
Alkaline Phosphatase: 77 U/L (ref 38–126)
Bilirubin, Direct: <0.1 mg/dL (ref 0.0–0.2)
Total Bilirubin: 0.6 mg/dL (ref 0.3–1.2)
Total Protein: 7.1 g/dL (ref 6.5–8.1)
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
## 2023-03-02 LAB — HCV RNA QUANT: HCV Quantitative: NOT DETECTED IU/mL (ref >50)

## 2023-03-10 ENCOUNTER — Telehealth: Payer: Self-pay

## 2023-03-10 NOTE — Telephone Encounter (Signed)
Patient informed of lab results and verbalized understanding. Patient aware to complete the Epclusa.  Bradley Bush

## 2023-03-10 NOTE — Telephone Encounter (Signed)
-----   Message from Lynn Ito, MD sent at 03/07/2023  5:09 PM EDT ----- Please let him know that HCV rna is undetectable, he needs to complete the entire 12 weeks of epclusa. Thx  ----- Message ----- From: Leory Plowman, Lab In [place] Sent: 03/01/2023  12:56 PM EDT To: Lynn Ito, MD

## 2023-03-15 ENCOUNTER — Other Ambulatory Visit (HOSPITAL_COMMUNITY): Payer: Self-pay

## 2023-03-18 ENCOUNTER — Other Ambulatory Visit (HOSPITAL_COMMUNITY): Payer: Self-pay

## 2023-04-13 ENCOUNTER — Other Ambulatory Visit (HOSPITAL_COMMUNITY): Payer: Self-pay

## 2023-04-29 ENCOUNTER — Other Ambulatory Visit (HOSPITAL_COMMUNITY): Payer: Self-pay

## 2023-05-31 ENCOUNTER — Ambulatory Visit: Payer: MEDICAID | Attending: Infectious Diseases | Admitting: Infectious Diseases

## 2023-05-31 ENCOUNTER — Encounter: Payer: Self-pay | Admitting: Infectious Diseases

## 2023-05-31 ENCOUNTER — Other Ambulatory Visit
Admission: RE | Admit: 2023-05-31 | Discharge: 2023-05-31 | Disposition: A | Discharge status: Home / Self Care | Payer: MEDICAID | Source: Ambulatory Visit | Attending: Infectious Diseases | Admitting: Infectious Diseases

## 2023-05-31 VITALS — BP 132/55 | HR 92 | Temp 97.0°F | Ht 76.0 in | Wt 171.0 lb

## 2023-05-31 DIAGNOSIS — F17210 Nicotine dependence, cigarettes, uncomplicated: Secondary | ICD-10-CM | POA: Diagnosis not present

## 2023-05-31 DIAGNOSIS — F32A Depression, unspecified: Secondary | ICD-10-CM | POA: Insufficient documentation

## 2023-05-31 DIAGNOSIS — B182 Chronic viral hepatitis C: Secondary | ICD-10-CM | POA: Insufficient documentation

## 2023-05-31 DIAGNOSIS — Z7982 Long term (current) use of aspirin: Secondary | ICD-10-CM | POA: Insufficient documentation

## 2023-05-31 DIAGNOSIS — F419 Anxiety disorder, unspecified: Secondary | ICD-10-CM | POA: Insufficient documentation

## 2023-05-31 DIAGNOSIS — B1920 Unspecified viral hepatitis C without hepatic coma: Secondary | ICD-10-CM | POA: Diagnosis present

## 2023-05-31 DIAGNOSIS — Z79899 Other long term (current) drug therapy: Secondary | ICD-10-CM | POA: Insufficient documentation

## 2023-05-31 DIAGNOSIS — Z791 Long term (current) use of non-steroidal anti-inflammatories (NSAID): Secondary | ICD-10-CM | POA: Diagnosis not present

## 2023-05-31 DIAGNOSIS — F17290 Nicotine dependence, other tobacco product, uncomplicated: Secondary | ICD-10-CM | POA: Diagnosis not present

## 2023-05-31 LAB — COMPREHENSIVE METABOLIC PANEL
ALT: 13 U/L (ref 0–44)
AST: 19 U/L (ref 15–41)
Albumin: 4.3 g/dL (ref 3.5–5.0)
Alkaline Phosphatase: 57 U/L (ref 38–126)
Anion gap: 7 (ref 5–15)
BUN: 17 mg/dL (ref 6–20)
CO2: 23 mmol/L (ref 22–32)
Calcium: 9.1 mg/dL (ref 8.9–10.3)
Chloride: 109 mmol/L (ref 98–111)
Creatinine, Ser: 1.09 mg/dL (ref 0.61–1.24)
GFR, Estimated: >60 mL/min (ref >60)
Glucose, Bld: 86 mg/dL (ref 70–99)
Potassium: 3.6 mmol/L (ref 3.5–5.1)
Sodium: 139 mmol/L (ref 135–145)
Total Bilirubin: 0.8 mg/dL (ref 0.3–1.2)
Total Protein: 7.7 g/dL (ref 6.5–8.1)

## 2023-05-31 NOTE — Patient Instructions (Addendum)
You are here for follow up of [facility]PC- you have completed 3 months-  Today will check RNA and next visit would be 6 months
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
## 2023-06-02 ENCOUNTER — Telehealth: Payer: Self-pay

## 2023-06-02 NOTE — Telephone Encounter (Signed)
Patient informed of lab results and verbalized understanding.  Bradley Bush T [name]  

## 2023-06-02 NOTE — Telephone Encounter (Signed)
-----   Message from Lynn Ito sent at 06/02/2023  9:19 AM EDT ----- Please let him know that HCV viral load is undetectable ----- Message ----- From: Interface, Lab In [place] Sent: 05/31/2023  11:44 AM EDT To: Lynn Ito, MD

## 2023-06-26 NOTE — Progress Notes (Unsigned)
  Established patient visit  Patient: Bradley Bush   DOB: 1985/11/04   37 y.o. Male  MRN: [id number] Visit Date: 06/28/2023  Today's healthcare provider: Debera Lat, PA-C   No chief complaint on file.  Subjective    HPI  *** Discussed the use of AI scribe software for clinical note transcription with the patient, who gave verbal consent to proceed.  History of Present Illness               05/31/2023   10:22 AM 03/01/2023   11:14 AM 02/09/2023    1:37 PM  Depression screen PHQ 2/9  Decreased Interest 0 0 1  Down, Depressed, Hopeless 0 0 1  PHQ - 2 Score 0 0 2  Altered sleeping  0 1  Tired, decreased energy  0 1  Change in appetite  0 1  Feeling bad or failure about yourself   0 1  Trouble concentrating  0 1  Moving slowly or fidgety/restless  0 1  Suicidal thoughts  0 1  PHQ-9 Score  0 9  Difficult doing work/chores  Not difficult at all Somewhat difficult      02/01/2023   10:44 AM 11/24/2022    1:57 PM  GAD 7 : Generalized Anxiety Score  Nervous, Anxious, on Edge 3 3  Control/stop worrying 3 3  Worry too much - different things 2 3  Trouble relaxing 2 0  Restless 2 3  Easily annoyed or irritable 2 1  Afraid - awful might happen 3 0  Total GAD 7 Score 17 13  Anxiety Difficulty  Not difficult at all    Medications: Outpatient Medications Prior to Visit  Medication Sig  . aspirin EC 325 MG tablet Take 1 tablet (325 mg total) by mouth 2 (two) times daily.  . diazepam (VALIUM) 5 MG tablet Take 5 mg by mouth every 12 (twelve) hours as needed.  . gabapentin (NEURONTIN) 300 MG capsule Take 300 mg by mouth 2 (two) times daily.  . meloxicam (MOBIC) 7.5 MG tablet Take 1 tablet (7.5 mg total) by mouth daily.  . naloxone (NARCAN) nasal spray 4 mg/0.1 mL Place 1 spray into the nose once.  . SUBLOCADE 300 MG/1.5ML SOSY    No facility-administered medications prior to visit.    Review of Systems  All other systems reviewed and are negative. Except see HPI   {Insert  previous labs (optional):23779} {See past labs  Heme  Chem  Endocrine  Serology  Results Review (optional):1}   Objective    There were no vitals taken for this visit. {Insert last BP/Wt (optional):23777}{See vitals history (optional):1}   Physical Exam Constitutional:      General: He is not in acute distress.    Appearance: Normal appearance. He is not diaphoretic.  HENT:     Head: Normocephalic.  Eyes:     Conjunctiva/sclera: Conjunctivae normal.  Pulmonary:     Effort: Pulmonary effort is normal. No respiratory distress.  Skin:    Findings: Lesion present.  Neurological:     Mental Status: He is alert and oriented to person, place, and time. Mental status is at baseline.     No results found for any visits on 06/28/23.  Assessment & Plan    *** Assessment and Plan              No follow-ups on file.      [facility]

## 2023-06-28 ENCOUNTER — Ambulatory Visit (INDEPENDENT_AMBULATORY_CARE_PROVIDER_SITE_OTHER): Payer: MEDICAID | Admitting: Physician Assistant

## 2023-06-28 ENCOUNTER — Telehealth: Payer: Self-pay | Admitting: Physician Assistant

## 2023-06-28 ENCOUNTER — Encounter: Payer: Self-pay | Admitting: Physician Assistant

## 2023-06-28 VITALS — BP 100/66 | HR 85 | Ht 77.0 in | Wt 169.8 lb

## 2023-06-28 DIAGNOSIS — B351 Tinea unguium: Secondary | ICD-10-CM

## 2023-06-28 DIAGNOSIS — F1920 Other psychoactive substance dependence, uncomplicated: Secondary | ICD-10-CM | POA: Diagnosis not present

## 2023-06-28 DIAGNOSIS — Z202 Contact with and (suspected) exposure to infections with a predominantly sexual mode of transmission: Secondary | ICD-10-CM

## 2023-06-28 DIAGNOSIS — N5319 Other ejaculatory dysfunction: Secondary | ICD-10-CM

## 2023-06-28 MED ORDER — TAVABOROLE 5 % EX SOLN
1.0000 | Freq: Every day | CUTANEOUS | 1 refills | Status: DC
Start: 2023-06-28 — End: 2023-07-04

## 2023-06-28 MED ORDER — CICLOPIROX 8 % EX KIT
1.0000 | PACK | Freq: Every day | CUTANEOUS | 1 refills | Status: DC
Start: 2023-06-28 — End: 2023-07-04

## 2023-06-28 NOTE — Telephone Encounter (Signed)
Received a fax from covermymeds for Tavaborole 5%   Key: [id number]

## 2023-06-28 NOTE — Telephone Encounter (Signed)
Covermymeds is requesting prior authorization Key: [id number] Name: [place] Ciclopirox Treatment 8% Kit has been rejected and requires further PA

## 2023-06-30 NOTE — Telephone Encounter (Signed)
Bradley Bush (Key: [id number]) - [id number] Tavaborole 5% solution status: PA RequestCreated: September 3rd, 2024 [phone]: September 5th, 2024

## 2023-07-01 NOTE — Telephone Encounter (Signed)
Patient need to try over the counter treatment first.

## 2023-07-01 NOTE — Telephone Encounter (Signed)
Outcome Denied today by [employer] 2017 Document: Decision Notes: Policy rules found in the [facility] Preferred Drug List (PDL) for ?Topical Antifungals? guided our decision. Here are the policy requirements your request did not meet: 1. Records show you have tried and failed at least two (2) preferred drugs from this drug class or group of drugs. Preferred drugs for this plan are ciclopirox cream/solution, clotrimazole cream (Rx), clotrimazole-betamethasone cream, ketoconazole cream/shampoo, Nyamyc powder, nystatin cream/ointment/powder, Nystop powder. Please note, the drugs listed here may require pre-approval and have limits on the amount covered. OR 2. Records show you have a documented allergy or contraindication to all the preferred drugs in this drug class or group of drugs. This means you have tried these drugs before and had a bad reaction such as a skin rash, itching, swelling, wheezing, or other breathing problems, or you have a medical reason why all the preferred drugs cannot be used. Since the policy requirements have not been met, we are not able to approve this request. If you cannot use other products for your health issue, your doctor can send more information to show why the other drugs cannot be used. Please look at the formulary to see what drugs are covered. Pre-approval may be needed, and quantity limits may apply to covered drugs. Drug Tavaborole 5% solution

## 2023-07-04 MED ORDER — CICLOPIROX 8 % EX SOLN
Freq: Every day | CUTANEOUS | 0 refills | Status: AC
Start: 2023-07-04 — End: ?

## 2023-07-04 NOTE — Telephone Encounter (Signed)
Outcome Denied on September 7 by [employer] 2017 Document: Decision Notes: Policy rules found in the [facility] Preferred Drug List (PDL) for ?Topical Antifungals? guided our decision. Here are the policy requirements your request did not meet: 1. Records show you have tried and failed at least two (2) preferred drugs from this drug class or group of drugs. Preferred drugs for this plan are ciclopirox cream, ciclopirox solution 8% (Penlac equivalent), clotrimazole cream (Rx), clotrimazole-betamethasone cream, ketoconazole cream/shampoo, Nyamyc powder, nystatin cream/ointment/powder, Nystop powder. Please note, the drugs listed here may require pre-approval and have limits on the amount covered. OR 2. Records show you have a documented allergy or contraindication to all the preferred drugs in this drug class or group of drugs. This means you have tried these drugs before and had a bad reaction such as a skin rash, itching, swelling, wheezing, or other breathing problems, or you have a medical reason why all the preferred drugs cannot be used. Since the policy requirements have not been met, we are not able to approve this request. If you cannot use other products for your health issue, your doctor can send more information to show why the other drugs cannot be used. Please look at the formulary to see what drugs are covered. Pre-approval may be needed, and quantity limits may apply to covered drugs. Drug Ciclopirox Treatment 8% kit

## 2023-07-06 ENCOUNTER — Other Ambulatory Visit: Payer: Self-pay | Admitting: Physician Assistant

## 2023-07-14 ENCOUNTER — Encounter: Payer: Self-pay | Admitting: Physician Assistant

## 2023-07-14 ENCOUNTER — Ambulatory Visit: Payer: MEDICAID | Admitting: Physician Assistant

## 2023-07-14 VITALS — BP 120/74 | HR 77 | Ht 77.0 in | Wt 162.8 lb

## 2023-07-14 DIAGNOSIS — Z0000 Encounter for general adult medical examination without abnormal findings: Secondary | ICD-10-CM

## 2023-07-14 NOTE — Progress Notes (Signed)
Complete physical exam  Patient: Bradley Bush   DOB: 05/19/1986   37 y.o. Male  MRN: [id number] Visit Date: 07/14/2023  Today's healthcare provider: Debera Lat, PA-C   Chief Complaint  Patient presents with   Annual Exam    Low back pain has come back again, pain level today is at a 5, has taken ibuprofen to help ease the pain, out of meloxicam that was previously prescribed    Subjective    Bradley Bush is a 37 y.o. male who presents today for a complete physical exam.   Discussed the use of AI scribe software for clinical note transcription with the patient, who gave verbal consent to proceed.  History of Present Illness   The patient, with a history of Hepatitis C (now negative) and opioid use disorder (currently on Sublocade), presents with back pain. The pain is most severe when changing positions, such as standing up or sitting down, and is attributed to a recent gym workout involving deadlifts. The patient has taken ibuprofen and used ice for relief. They also mention discomfort in their feet when first getting up and walking, describing the sensation as painful for the first few steps.  The patient's appetite has decreased, leading to weight loss. Despite this, they report good sleep and regular exercise through their work and gym activities. They have recently seen a dentist and have an upcoming appointment with a urologist. The patient also mentions a desire for a tetanus shot.        Last depression screening scores    05/31/2023   10:22 AM 03/01/2023   11:14 AM 02/09/2023    1:37 PM  PHQ 2/9 Scores  PHQ - 2 Score 0 0 2  PHQ- 9 Score  0 9   Last fall risk screening    05/31/2023   10:22 AM  Fall Risk   Falls in the past year? 0  Risk for fall due to : No Fall Risks  Follow up Falls evaluation completed   Last Audit-C alcohol use screening    02/09/2023    1:36 PM  Alcohol Use Disorder Test (AUDIT)  1. How often do you have a drink containing alcohol? 0   2. How many drinks containing alcohol do you have on a typical day when you are drinking? 0  3. How often do you have six or more drinks on one occasion? 0  AUDIT-C Score 0   A score of 3 or more in women, and 4 or more in men indicates increased risk for alcohol abuse, EXCEPT if all of the points are from question 1   Past Medical History:  Diagnosis Date   Drug abuse (HCC)    Hepatitis C    Past Surgical History:  Procedure Laterality Date   INTRAMEDULLARY (IM) NAIL INTERTROCHANTERIC Left 12/01/2022   Procedure: INTRAMEDULLARY (IM) NAIL INTERTROCHANTERIC;  Surgeon: Christena Flake, MD;  Location: [facility] ORS;  Service: Orthopedics;  Laterality: Left;   Social History   Socioeconomic History   Marital status: Single    Spouse name: Not on file   Number of children: Not on file   Years of education: Not on file   Highest education level: Not on file  Occupational History   Not on file  Tobacco Use   Smoking status: Every Day    Current packs/day: 0.00    Types: Cigarettes, E-cigarettes    Last attempt to quit: 08/25/2022    Years since quitting: 0.8  Smokeless tobacco: Never  Vaping Use   Vaping status: Every Day   Start date: 08/25/2022  Substance and Sexual Activity   Alcohol use: Not Currently   Drug use: Not Currently    Types: Cocaine, IV    Comment: history of   Sexual activity: Yes  Other Topics Concern   Not on file  Social History Narrative   Not on file   Social Determinants of Health   Financial Resource Strain: Not on file  Food Insecurity: No Food Insecurity (12/01/2022)   Hunger Vital Sign    Worried About Running Out of Food in the Last Year: Never true    Ran Out of Food in the Last Year: Never true  Transportation Needs: No Transportation Needs (12/01/2022)   PRAPARE - Administrator, Civil Service (Medical): No    Lack of Transportation (Non-Medical): No  Physical Activity: Not on file  Stress: Not on file  Social Connections: Not on file   Intimate Partner Violence: Not At Risk (12/01/2022)   Humiliation, Afraid, Rape, and Kick questionnaire    Fear of Current or Ex-Partner: No    Emotionally Abused: No    Physically Abused: No    Sexually Abused: No   Family Status  Relation Name Status   Mother  Alive   Father  Alive  No partnership data on file   Family History  Problem Relation Age of Onset   Diabetes Mother    No Known Allergies  Patient Care Team: Debera Lat, PA-C as PCP - General (Physician Assistant)   Medications: Outpatient Medications Prior to Visit  Medication Sig   busPIRone (BUSPAR) 5 MG tablet Take 5 mg by mouth 2 (two) times daily. Take one tablet by mouth 2x daily   ciclopirox (PENLAC) 8 % solution Apply topically at bedtime. Apply over nail and surrounding skin. Apply daily over previous coat. After seven (7) days, may remove with alcohol and continue cycle.   diazepam (VALIUM) 5 MG tablet Take 5 mg by mouth every 12 (twelve) hours as needed.   gabapentin (NEURONTIN) 300 MG capsule Take 300 mg by mouth 2 (two) times daily.   meloxicam (MOBIC) 7.5 MG tablet Take 1 tablet (7.5 mg total) by mouth daily. (Patient taking differently: Take 7.5 mg by mouth as needed.)   naloxone (NARCAN) nasal spray 4 mg/0.1 mL Place 1 spray into the nose once.   SUBLOCADE 300 MG/1.5ML SOSY    [DISCONTINUED] aspirin EC 325 MG tablet Take 1 tablet (325 mg total) by mouth 2 (two) times daily.   No facility-administered medications prior to visit.    Review of Systems  All other systems reviewed and are negative.  Except see HPI     Objective    BP 120/74 (BP Location: Right Arm, Patient Position: Sitting, Cuff Size: Normal)   Pulse 77   Ht 6\' 5"  (1.956 m)   Wt 162 lb 12.8 oz (73.8 kg)   BMI 19.31 kg/m      Physical Exam Vitals reviewed.  Constitutional:      General: He is not in acute distress.    Appearance: Normal appearance. He is well-developed. He is not ill-appearing, toxic-appearing or  diaphoretic.  HENT:     Head: Normocephalic and atraumatic.     Right Ear: Tympanic membrane, ear canal and external ear normal. There is impacted cerumen (partially).     Left Ear: Tympanic membrane, ear canal and external ear normal. There is impacted cerumen (partially).  Nose: Nose normal. No congestion or rhinorrhea.     Mouth/Throat:     Mouth: Mucous membranes are moist.     Pharynx: Oropharynx is clear. No oropharyngeal exudate.  Eyes:     General: No scleral icterus.       Right eye: No discharge.        Left eye: No discharge.     Conjunctiva/sclera: Conjunctivae normal.     Pupils: Pupils are equal, round, and reactive to light.  Neck:     Thyroid: No thyromegaly.     Vascular: No carotid bruit.  Cardiovascular:     Rate and Rhythm: Normal rate and regular rhythm.     Pulses: Normal pulses.     Heart sounds: Normal heart sounds. No murmur heard.    No friction rub. No gallop.  Pulmonary:     Effort: Pulmonary effort is normal. No respiratory distress.     Breath sounds: Normal breath sounds. No wheezing or rales.  Abdominal:     General: Abdomen is flat. Bowel sounds are normal. There is no distension.     Palpations: Abdomen is soft. There is no mass.     Tenderness: There is no abdominal tenderness. There is no right CVA tenderness, left CVA tenderness, guarding or rebound.     Hernia: No hernia is present.  Musculoskeletal:        General: No swelling, tenderness, deformity or signs of injury. Normal range of motion.     Cervical back: Normal range of motion and neck supple. No rigidity or tenderness.     Right lower leg: No edema.     Left lower leg: No edema.  Lymphadenopathy:     Cervical: No cervical adenopathy.  Skin:    General: Skin is warm and dry.     Coloration: Skin is not jaundiced or pale.     Findings: No bruising, erythema, lesion or rash.  Neurological:     Mental Status: He is alert and oriented to person, place, and time. Mental status is  at baseline.     Gait: Gait normal.  Psychiatric:        Mood and Affect: Mood normal.        Behavior: Behavior normal.        Thought Content: Thought content normal.        Judgment: Judgment normal.      No results found for any visits on 07/14/23.  Assessment & Plan    Routine Health Maintenance and Physical Exam  Exercise Activities and Dietary recommendations  Goals   None     Immunization History  Administered Date(s) Administered   Hep A / Hep B 05/15/2021   Moderna Sars-Covid-2 Vaccination 03/08/2020, 04/11/2020   Tdap 02/01/2023    Health Maintenance  Topic Date Due   COVID-19 Vaccine (3 - 2023-24 season) 06/26/2023   Flu Shot  01/23/2024*   DTaP/Tdap/Td vaccine (2 - Td or Tdap) 01/31/2033   Hepatitis C Screening  Completed   HIV Screening  Completed   HPV Vaccine  Aged Out  *Topic was postponed. The date shown is not the original due date.    Discussed health benefits of physical activity, and encouraged him to engage in regular exercise appropriate for his age and condition. 1. Annual physical exam UTD on dental/eye Things to do to keep yourself healthy  - Exercise at least 30-45 minutes a day, 3-4 days a week.  - Eat a low-fat diet with lots of fruits and  vegetables, up to 7-9 servings per day.  - Seatbelts can save your life. Wear them always.  - Smoke detectors on every level of your home, check batteries every year.  - Eye Doctor - have an eye exam every 1-2 years  - Safe sex - if you may be exposed to STDs, use a condom.  - Alcohol -  If you drink, do it moderately, less than 2 drinks per day.  - Health Care Power of Attorney. Choose someone to speak for you if you are not able.  - Depression is common in our stressful world.If you're feeling down or losing interest in things you normally enjoy, please come in for a visit.  - Violence - If anyone is threatening or hurting you, please call immediately.     Back Pain Acute exacerbation of back  pain after gym workout, specifically after deadlifts. Pain is worse with changing positions. Patient has been self-managing with ice, Tylenol, and ibuprofen. -Advise to rest and avoid strenuous activities for one week. -Continue with over-the-counter pain management. -Consider use of a back brace for support. -Consult with a gym trainer to ensure proper form during workouts, specifically deadlifts.  Ear Wax Patient reports occasional difficulty hearing. Examination revealed some ear congestion. -Advise use of over-the-counter Debrox to soften ear wax before cleaning with Q-tips.  Foot Pain Patient reports pain in the feet upon first standing, suggestive of plantar fasciitis. -Advise special exercises for plantar fasciitis and ensure well-fitting shoes.  Hepatitis C Patient reports completion of treatment and recent negative test result. -Continue follow-up as advised by treating physician.  General Health Maintenance -Advise regular eye and dental check-ups. -Encourage healthy diet with emphasis on vegetables, fruits, and lean proteins. -Continue regular exercise, but avoid strenuous activities that exacerbate back pain. -Schedule annual physical exam in one year. -Administer tetanus shot when patient's back pain has resolved.      Return in about 1 year (around 07/13/2024) for CPE.    The patient was advised to call back or seek an in-person evaluation if the symptoms worsen or if the condition fails to improve as anticipated.  I discussed the assessment and treatment plan with the patient. The patient was provided an opportunity to ask questions and all were answered. The patient agreed with the plan and demonstrated an understanding of the instructions.  I, Debera Lat, PA-C have reviewed all documentation for this visit. The documentation on  07/14/23  for the exam, diagnosis, procedures, and orders are all accurate and complete.  Debera Lat, [facility], MMS [facility] [phone] (phone) [phone] (fax)  [facility]

## 2023-07-15 ENCOUNTER — Encounter: Payer: Self-pay | Admitting: Urology

## 2023-07-15 ENCOUNTER — Ambulatory Visit (INDEPENDENT_AMBULATORY_CARE_PROVIDER_SITE_OTHER): Payer: MEDICAID | Admitting: Urology

## 2023-07-15 VITALS — BP 126/86 | HR 106 | Ht 76.0 in | Wt 166.0 lb

## 2023-07-15 DIAGNOSIS — F5232 Male orgasmic disorder: Secondary | ICD-10-CM | POA: Diagnosis not present

## 2023-07-15 NOTE — Progress Notes (Signed)
I, Maysun Anabel Bene, acting as a scribe for Riki Altes, MD., have documented all relevant documentation on the behalf of Riki Altes, MD, as directed by Riki Altes, MD while in the presence of Riki Altes, MD.  07/15/2023 3:10 PM   Bradley Bush 06-09-1986 [id number]  Referring provider: Debera Lat, PA-C [address] [place],  [place] [address]  Chief Complaint  Patient presents with   Other    HPI: Bradley Bush is a 37 y.o. male referred for evaluation of ejaculatory dysfunction.   Presents with a 2 month history of inability to ejaculate.  Recently in a relationship and he has no problems obtaining an erection, however has been unable to achieve ejaculation.  He is able to ejaculate with masturbation, but states it does take longer to achieve. Potential medication side effects include Buspar.   PMH: Past Medical History:  Diagnosis Date   Drug abuse (HCC)    Hepatitis C     Surgical History: Past Surgical History:  Procedure Laterality Date   INTRAMEDULLARY (IM) NAIL INTERTROCHANTERIC Left 12/01/2022   Procedure: INTRAMEDULLARY (IM) NAIL INTERTROCHANTERIC;  Surgeon: Christena Flake, MD;  Location: [facility] ORS;  Service: Orthopedics;  Laterality: Left;    Home Medications:  Allergies as of 07/15/2023   No Known Allergies      Medication List        Accurate as of July 15, 2023  3:10 PM. If you have any questions, ask your nurse or doctor.          STOP taking these medications    aspirin EC 325 MG tablet Stopped by: Riki Altes       TAKE these medications    busPIRone 5 MG tablet Commonly known as: BUSPAR Take 5 mg by mouth 2 (two) times daily. Take one tablet by mouth 2x daily   ciclopirox 8 % solution Commonly known as: PENLAC Apply topically at bedtime. Apply over nail and surrounding skin. Apply daily over previous coat. After seven (7) days, may remove with alcohol and continue cycle.   diazepam 5 MG  tablet Commonly known as: VALIUM Take 5 mg by mouth every 12 (twelve) hours as needed.   gabapentin 300 MG capsule Commonly known as: NEURONTIN Take 300 mg by mouth 2 (two) times daily.   meloxicam 7.5 MG tablet Commonly known as: MOBIC Take 1 tablet (7.5 mg total) by mouth daily. What changed:  when to take this reasons to take this   naloxone 4 MG/0.1ML Liqd nasal spray kit Commonly known as: NARCAN Place 1 spray into the nose once.   Sublocade 300 MG/1.5ML Sosy Generic drug: Buprenorphine ER        Allergies: No Known Allergies  Family History: Family History  Problem Relation Age of Onset   Diabetes Mother     Social History:  reports that he has been smoking cigarettes and e-cigarettes. He has never used smokeless tobacco. He reports that he does not currently use alcohol. He reports that he does not currently use drugs after having used the following drugs: Cocaine and IV.   Physical Exam: BP 126/86   Pulse (!) 106   Ht 6\' 4"  (1.93 m)   Wt 166 lb (75.3 kg)   BMI 20.21 kg/m   Constitutional:  Alert and oriented, No acute distress. HEENT: [place] AT. Respiratory: Normal respiratory effort, no increased work of breathing. GU: Testes descended bilaterally without masses or tenderness.  Estimated testicular volume 20 cc  bilateral.  Phallus without lesions.  Psychiatric: Normal mood and affect.   Assessment & Plan:    1. Delayed ejaculation Most likely secondary to Buspar.  Will schedule AM testosterone level and call with results.   I have reviewed the above documentation for accuracy and completeness, and I agree with the above.   Riki Altes, MD  [facility] [address], [address] [place], [place] [address] [phone]

## 2023-07-17 ENCOUNTER — Encounter: Payer: Self-pay | Admitting: Urology

## 2023-07-21 ENCOUNTER — Other Ambulatory Visit: Payer: Self-pay | Admitting: *Deleted

## 2023-07-21 ENCOUNTER — Other Ambulatory Visit
Admission: RE | Admit: 2023-07-21 | Discharge: 2023-07-21 | Disposition: A | Discharge status: Home / Self Care | Payer: MEDICAID | Attending: Urology | Admitting: Urology

## 2023-07-21 ENCOUNTER — Other Ambulatory Visit: Payer: MEDICAID

## 2023-07-21 ENCOUNTER — Other Ambulatory Visit: Payer: Self-pay

## 2023-07-21 DIAGNOSIS — E291 Testicular hypofunction: Secondary | ICD-10-CM

## 2023-07-21 DIAGNOSIS — F5232 Male orgasmic disorder: Secondary | ICD-10-CM

## 2023-07-21 NOTE — Addendum Note (Signed)
Addended by: Yehuda Savannah on: 07/21/2023 08:22 AM   Modules accepted: Orders

## 2023-07-22 LAB — TESTOSTERONE: Testosterone: 566 ng/dL (ref 264–916)

## 2023-09-15 ENCOUNTER — Ambulatory Visit: Payer: MEDICAID | Admitting: Physician Assistant

## 2024-02-20 ENCOUNTER — Ambulatory Visit (INDEPENDENT_AMBULATORY_CARE_PROVIDER_SITE_OTHER): Payer: MEDICAID | Admitting: Physician Assistant

## 2024-02-20 ENCOUNTER — Encounter: Payer: Self-pay | Admitting: Physician Assistant

## 2024-02-20 VITALS — BP 113/72 | HR 92 | Resp 16 | Ht 77.0 in | Wt 153.3 lb

## 2024-02-20 DIAGNOSIS — B351 Tinea unguium: Secondary | ICD-10-CM | POA: Diagnosis not present

## 2024-02-20 DIAGNOSIS — R202 Paresthesia of skin: Secondary | ICD-10-CM | POA: Diagnosis not present

## 2024-02-20 DIAGNOSIS — Z720 Tobacco use: Secondary | ICD-10-CM

## 2024-02-20 DIAGNOSIS — R636 Underweight: Secondary | ICD-10-CM

## 2024-02-20 DIAGNOSIS — F419 Anxiety disorder, unspecified: Secondary | ICD-10-CM | POA: Diagnosis not present

## 2024-02-20 DIAGNOSIS — F1920 Other psychoactive substance dependence, uncomplicated: Secondary | ICD-10-CM

## 2024-02-20 DIAGNOSIS — F339 Major depressive disorder, recurrent, unspecified: Secondary | ICD-10-CM

## 2024-02-20 DIAGNOSIS — Z681 Body mass index (BMI) 19 or less, adult: Secondary | ICD-10-CM

## 2024-02-20 DIAGNOSIS — R5383 Other fatigue: Secondary | ICD-10-CM

## 2024-02-20 MED ORDER — MIRTAZAPINE 7.5 MG PO TABS
7.5000 mg | ORAL_TABLET | Freq: Every day | ORAL | 0 refills | Status: DC
Start: 2024-02-20 — End: 2024-03-20

## 2024-02-20 NOTE — Progress Notes (Unsigned)
 Established patient visit  Patient: Bradley Bush   DOB: 05-02-1986   38 y.o. Male  MRN: [id number] Visit Date: 02/20/2024  Today's healthcare provider: Blane Bunting, PA-C   Chief Complaint  Patient presents with   Follow-up    F/U . No other concerns   Subjective      Discussed the use of AI scribe software for clinical note transcription with the patient, who gave verbal consent to proceed.  History of Present Illness  Pt presents for management of his chronic conditions. He vapes daily. Reports having anxiety and depression.     02/20/2024   10:06 AM 02/01/2023   10:44 AM 11/24/2022    1:57 PM  GAD 7 : Generalized Anxiety Score  Nervous, Anxious, on Edge 0 3 3  Control/stop worrying 1 3 3   Worry too much - different things 0 2 3  Trouble relaxing 0 2 0  Restless 0 2 3  Easily annoyed or irritable 0 2 1  Afraid - awful might happen 1 3 0  Total GAD 7 Score 2 17 13   Anxiety Difficulty Not difficult at all  Not difficult at all       02/20/2024   10:05 AM 05/31/2023   10:22 AM 03/01/2023   11:14 AM  PHQ9 SCORE ONLY  PHQ-9 Total Score 2 0 0  Has been taking buspar  in the past.  Endorses having toenails problems. Has been using topical antifungal without significant relief.  Worries about his current weight after treatment for hep c, lack of appetite, fatigue. In the past, remeron  was helpful with weight gain.  Attends weekly AA meetings for polysubstance abuse. Works part-time  Notices having tingling in his hands.     02/20/2024   10:05 AM 05/31/2023   10:22 AM 03/01/2023   11:14 AM  Depression screen PHQ 2/9  Decreased Interest 0 0 0  Down, Depressed, Hopeless 0 0 0  PHQ - 2 Score 0 0 0  Altered sleeping 0  0  Tired, decreased energy 0  0  Change in appetite 1  0  Feeling bad or failure about yourself  1  0  Trouble concentrating 0  0  Moving slowly or fidgety/restless 0  0  Suicidal thoughts 0  0  PHQ-9 Score 2  0  Difficult doing work/chores Not difficult  at all  Not difficult at all      02/20/2024   10:06 AM 02/01/2023   10:44 AM 11/24/2022    1:57 PM  GAD 7 : Generalized Anxiety Score  Nervous, Anxious, on Edge 0 3 3  Control/stop worrying 1 3 3   Worry too much - different things 0 2 3  Trouble relaxing 0 2 0  Restless 0 2 3  Easily annoyed or irritable 0 2 1  Afraid - awful might happen 1 3 0  Total GAD 7 Score 2 17 13   Anxiety Difficulty Not difficult at all  Not difficult at all    Medications: Outpatient Medications Prior to Visit  Medication Sig   busPIRone  (BUSPAR ) 5 MG tablet Take 5 mg by mouth 2 (two) times daily. Take one tablet by mouth 2x daily   ciclopirox  (PENLAC ) 8 % solution Apply topically at bedtime. Apply over nail and surrounding skin. Apply daily over previous coat. After seven (7) days, may remove with alcohol and continue cycle.   diazepam (VALIUM) 5 MG tablet Take 5 mg by mouth every 12 (twelve) hours as needed.   gabapentin  (NEURONTIN ) 300 MG capsule  Take 300 mg by mouth 2 (two) times daily.   meloxicam  (MOBIC ) 7.5 MG tablet Take 1 tablet (7.5 mg total) by mouth daily. (Patient taking differently: Take 7.5 mg by mouth as needed.)   naloxone (NARCAN) nasal spray 4 mg/0.1 mL Place 1 spray into the nose once.   SUBLOCADE 300 MG/1.5ML SOSY    No facility-administered medications prior to visit.    Review of Systems All negative Except see HPI       Objective    BP 113/72 (BP Location: Right Arm, Patient Position: Sitting, Cuff Size: Normal)   Pulse 92   Resp 16   Ht 6\' 5"  (1.956 m)   Wt 153 lb 4.8 oz (69.5 kg)   SpO2 97%   BMI 18.18 kg/m     Physical Exam Vitals reviewed.  Constitutional:      General: He is not in acute distress.    Appearance: Normal appearance. He is not diaphoretic.  HENT:     Head: Normocephalic and atraumatic.  Eyes:     General: No scleral icterus.    Conjunctiva/sclera: Conjunctivae normal.  Cardiovascular:     Rate and Rhythm: Normal rate and regular rhythm.      Pulses: Normal pulses.     Heart sounds: Normal heart sounds. No murmur heard. Pulmonary:     Effort: Pulmonary effort is normal. No respiratory distress.     Breath sounds: Normal breath sounds. No wheezing or rhonchi.  Musculoskeletal:     Cervical back: Neck supple.     Right lower leg: No edema.     Left lower leg: No edema.  Lymphadenopathy:     Cervical: No cervical adenopathy.  Skin:    General: Skin is warm and dry.     Findings: No rash.  Neurological:     Mental Status: He is alert and oriented to person, place, and time. Mental status is at baseline.  Psychiatric:        Mood and Affect: Mood normal.        Behavior: Behavior normal.      No results found for any visits on 02/20/24.      Assessment & Plan  Current every day nicotine  vaping (Primary) Patient was advised to quit vaping  Patient seems reluctant. Will reassess at the next appt   Anxiety/depression Endorses having symptoms despite low phq9 and gad7 Mirtazapine  7.5 ordered Will follow-up  Onychomycosis Topicals were not helpful. Hesitant to use systemic antifungal due to hx of liver problems - Ambulatory referral to Podiatry  Tingling In the hands Will check some labs and reassess  Other fatigue Chronic workup - CBC with Differential/Platelet - Comprehensive metabolic panel with GFR - TSH - Magnesium  - Vitamin B12 - VITAMIN D 25 Hydroxy (Vit-D Deficiency, Fractures) Will reassess after  receiving lab results Will FU  Underweight (BMI < 18.5) Chronic after treatment for hep C - mirtazapine  (REMERON ) 7.5 MG tablet; Take 1 tablet (7.5 mg total) by mouth at bedtime.  Dispense: 30 tablet; Refill: 0 Will do labs and reassess  Polysubstance abuse Sober for a year since 03/2023? AA meetings Will follow-up Works part-time  Orders Placed This Encounter  Procedures   CBC with Differential/Platelet   Comprehensive metabolic panel with GFR   TSH   Magnesium    Vitamin B12   VITAMIN  D 25 Hydroxy (Vit-D Deficiency, Fractures)   Ambulatory referral to Podiatry    Referral Priority:   Routine    Referral Type:   Consultation  Referral Reason:   Specialty Services Required    Requested Specialty:   Podiatry    Number of Visits Requested:   1    Return for chronic disease f/u.   The patient was advised to call back or seek an in-person evaluation if the symptoms worsen or if the condition fails to improve as anticipated.  I discussed the assessment and treatment plan with the patient. The patient was provided an opportunity to ask questions and all were answered. The patient agreed with the plan and demonstrated an understanding of the instructions.  I, Wood Novacek, PA-C have reviewed all documentation for this visit. The documentation on 02/20/2024  for the exam, diagnosis, procedures, and orders are all accurate and complete.  Blane Bunting, [facility], MMS [facility] [phone] (phone) [phone] (fax)  [facility]

## 2024-02-20 NOTE — Progress Notes (Unsigned)
 No to all Vaccines.

## 2024-02-21 ENCOUNTER — Encounter: Payer: Self-pay | Admitting: Physician Assistant

## 2024-02-21 LAB — CBC WITH DIFFERENTIAL/PLATELET
Basophils Absolute: 0 10*3/uL (ref 0.0–0.2)
Basos: 1 %
EOS (ABSOLUTE): 0.2 10*3/uL (ref 0.0–0.4)
Eos: 4 %
Hematocrit: 45.1 % (ref 37.5–51.0)
Hemoglobin: 15.5 g/dL (ref 13.0–17.7)
Immature Grans (Abs): 0 10*3/uL (ref 0.0–0.1)
Immature Granulocytes: 0 %
Lymphocytes Absolute: 1.4 10*3/uL (ref 0.7–3.1)
Lymphs: 32 %
MCH: 32.4 pg (ref 26.6–33.0)
MCHC: 34.4 g/dL (ref 31.5–35.7)
MCV: 94 fL (ref 79–97)
Monocytes Absolute: 0.4 10*3/uL (ref 0.1–0.9)
Monocytes: 9 %
Neutrophils Absolute: 2.4 10*3/uL (ref 1.4–7.0)
Neutrophils: 54 %
Platelets: 183 10*3/uL (ref 150–450)
RBC: 4.79 x10E6/uL (ref 4.14–5.80)
RDW: 12.2 % (ref 11.6–15.4)
WBC: 4.4 10*3/uL (ref 3.4–10.8)

## 2024-02-21 LAB — VITAMIN B12: Vitamin B-12: 398 pg/mL (ref 232–1245)

## 2024-02-21 LAB — COMPREHENSIVE METABOLIC PANEL WITH GFR
ALT: 23 IU/L (ref 0–44)
AST: 45 IU/L — ABNORMAL HIGH (ref 0–40)
Albumin: 4.9 g/dL (ref 4.1–5.1)
Alkaline Phosphatase: 55 IU/L (ref 44–121)
BUN/Creatinine Ratio: 18 (ref 9–20)
BUN: 18 mg/dL (ref 6–24)
Bilirubin Total: 0.6 mg/dL (ref 0.0–1.2)
CO2: 22 mmol/L (ref 20–29)
Calcium: 9.8 mg/dL (ref 8.7–10.2)
Chloride: 104 mmol/L (ref 96–106)
Creatinine, Ser: 1.02 mg/dL (ref 0.76–1.27)
Globulin, Total: 2.6 g/dL (ref 1.5–4.5)
Glucose: 96 mg/dL (ref 70–99)
Potassium: 4.9 mmol/L (ref 3.5–5.2)
Sodium: 139 mmol/L (ref 134–144)
Total Protein: 7.5 g/dL (ref 6.0–8.5)
eGFR: 95 mL/min/{1.73_m2} (ref >59)

## 2024-02-21 LAB — TSH: TSH: 1.77 u[IU]/mL (ref 0.450–4.500)

## 2024-02-21 LAB — VITAMIN D 25 HYDROXY (VIT D DEFICIENCY, FRACTURES): Vit D, 25-Hydroxy: 55.9 ng/mL (ref 30.0–100.0)

## 2024-02-21 LAB — MAGNESIUM: Magnesium: 2.1 mg/dL (ref 1.6–2.3)

## 2024-03-07 ENCOUNTER — Encounter: Payer: Self-pay | Admitting: Podiatry

## 2024-03-07 ENCOUNTER — Ambulatory Visit: Payer: MEDICAID | Admitting: Podiatry

## 2024-03-07 VITALS — Ht 77.0 in | Wt 153.0 lb

## 2024-03-07 DIAGNOSIS — B351 Tinea unguium: Secondary | ICD-10-CM | POA: Diagnosis not present

## 2024-03-07 MED ORDER — TERBINAFINE HCL 250 MG PO TABS: 250.0000 mg | ORAL_TABLET | Freq: Every day | ORAL | 0 refills | Status: AC

## 2024-03-07 NOTE — Patient Instructions (Signed)
 Soak Instructions    THE DAY AFTER THE PROCEDURE  Place 1/4 cup of epsom salts (or betadine, or white vinegar) in a quart of warm tap water.  Submerge your foot or feet with outer bandage intact for the initial soak; this will allow the bandage to become moist and wet for easy lift off.  Once you remove your bandage, continue to soak in the solution for 20 minutes.  This soak should be done twice a day.  Next, remove your foot or feet from solution, blot dry the affected area and apply 2-3 of the Cortisporin drops if they were prescribed for you.  If you did not receive a prescription use regular Neosporin or antibiotic ointment.  Then cover with a regular Band-Aid.  Do this for at least 2 weeks.  Longer if you are still having drainage redness or irritation  IF YOUR SKIN BECOMES IRRITATED WHILE USING THESE INSTRUCTIONS, IT IS OKAY TO SWITCH TO  WHITE VINEGAR AND WATER. Or you may use antibacterial soap and water to keep the toe clean  Monitor for any signs/symptoms of infection. Call the office immediately if any occur or go directly to the emergency room. Call with any questions/concerns.

## 2024-03-11 ENCOUNTER — Encounter: Payer: Self-pay | Admitting: Podiatry

## 2024-03-11 NOTE — Progress Notes (Signed)
  Subjective:  Patient ID: Bradley Bush, male    DOB: July 02, 1986,  MRN: [id number]  Chief Complaint  Patient presents with   Nail Problem    Bilateral Hallux nail fungus needs removal    38 y.o. male presents with the above complaint. History confirmed with patient.  They have bothered him for some time has not had successful treatment before, thinks he may have gotten it while he was incarcerated  Objective:  Physical Exam: warm, good capillary refill, no trophic changes or ulcerative lesions, normal DP and PT pulses, normal sensory exam, and onychomycosis.  Assessment:   1. Onychomycosis      Plan:  Patient was evaluated and treated and all questions answered.  Discussed treatment options and recommended oral therapy.  Due to the severe amount of dystrophy also recommend removal of the nail plates.  Alternatively we discussed permanent removal.  He opted for temporary removal and treatment with Lamisil .  Following consent and discussion of the risks and benefits the bilateral hallux was anesthetized with lidocaine and Marcaine , prepped with Betadine and exsanguinated with a tourniquet secured around the base of the toe.  The bilateral hallux nail plate was removed successfully irrigated with alcohol and a Silvadene compression bandage was applied.  Rx for Lamisil  90-day course sent to pharmacy and would like to check his LFTs and 6 weeks, order for this was placed and given to him as well.  Return as needed if it does not improve.  Return if symptoms worsen or fail to improve.

## 2024-03-19 ENCOUNTER — Other Ambulatory Visit: Payer: Self-pay | Admitting: Physician Assistant

## 2024-03-19 DIAGNOSIS — R636 Underweight: Secondary | ICD-10-CM

## 2024-03-23 ENCOUNTER — Encounter: Payer: Self-pay | Admitting: Physician Assistant

## 2024-03-23 ENCOUNTER — Ambulatory Visit: Payer: MEDICAID | Admitting: Physician Assistant

## 2024-03-23 VITALS — BP 120/78 | HR 89 | Resp 16 | Ht 76.0 in | Wt 165.4 lb

## 2024-03-23 DIAGNOSIS — R202 Paresthesia of skin: Secondary | ICD-10-CM

## 2024-03-23 DIAGNOSIS — Z720 Tobacco use: Secondary | ICD-10-CM | POA: Diagnosis not present

## 2024-03-23 DIAGNOSIS — F339 Major depressive disorder, recurrent, unspecified: Secondary | ICD-10-CM | POA: Diagnosis not present

## 2024-03-23 DIAGNOSIS — R636 Underweight: Secondary | ICD-10-CM | POA: Diagnosis not present

## 2024-03-23 DIAGNOSIS — B351 Tinea unguium: Secondary | ICD-10-CM

## 2024-03-23 DIAGNOSIS — R5383 Other fatigue: Secondary | ICD-10-CM

## 2024-03-23 DIAGNOSIS — Z5181 Encounter for therapeutic drug level monitoring: Secondary | ICD-10-CM

## 2024-03-23 DIAGNOSIS — Z681 Body mass index (BMI) 19 or less, adult: Secondary | ICD-10-CM

## 2024-03-23 DIAGNOSIS — F1920 Other psychoactive substance dependence, uncomplicated: Secondary | ICD-10-CM

## 2024-03-23 DIAGNOSIS — F419 Anxiety disorder, unspecified: Secondary | ICD-10-CM

## 2024-03-23 MED ORDER — MIRTAZAPINE 7.5 MG PO TABS
7.5000 mg | ORAL_TABLET | Freq: Every day | ORAL | 0 refills | Status: AC
Start: 2024-03-23 — End: ?

## 2024-03-23 NOTE — Progress Notes (Signed)
 Established patient visit  Patient: Bradley Bush   DOB: 1985-12-19   38 y.o. Male  MRN: [id number] Visit Date: 03/23/2024  Today's healthcare provider: Blane Bunting, PA-C   Chief Complaint  Patient presents with   Follow-up    1 month f/u. No other concerns   Subjective     HPI     Follow-up    Additional comments: 1 month f/u. No other concerns      Last edited by Estill Hemming, CMA on 03/23/2024  1:09 PM.       Discussed the use of AI scribe software for clinical note transcription with the patient, who gave verbal consent to proceed.  History of Present Illness Bradley Bush is a 38 year old male who presents for a follow-up visit after toenail removal and ongoing management of anxiety and depression.  His toenails, removed by a podiatrist, are healing well without pain, infection, or redness. He uses a Band-Aid on the affected area. Anxiety and depression have improved with his current medication regimen, which includes mirtazapine  and Suboxone. He has gained weight, from 153 to 165 pounds over the past month. Initial vivid dreams with mirtazapine  have lessened. He continues to vape regularly and finds it challenging to quit abruptly. He is on Lamisil  for four weeks, once daily, following toenail removal, with liver function monitored due to a history of hepatitis C. He has not used Debrox for earwax removal but uses hydrogen peroxide and a camera device to inspect his ears, noting some buildup, particularly in the left ear.       02/20/2024   10:05 AM 05/31/2023   10:22 AM 03/01/2023   11:14 AM  Depression screen PHQ 2/9  Decreased Interest 0 0 0  Down, Depressed, Hopeless 0 0 0  PHQ - 2 Score 0 0 0  Altered sleeping 0  0  Tired, decreased energy 0  0  Change in appetite 1  0  Feeling bad or failure about yourself  1  0  Trouble concentrating 0  0  Moving slowly or fidgety/restless 0  0  Suicidal thoughts 0  0  PHQ-9 Score 2  0  Difficult doing work/chores Not  difficult at all  Not difficult at all      02/20/2024   10:06 AM 02/01/2023   10:44 AM 11/24/2022    1:57 PM  GAD 7 : Generalized Anxiety Score  Nervous, Anxious, on Edge 0 3 3  Control/stop worrying 1 3 3   Worry too much - different things 0 2 3  Trouble relaxing 0 2 0  Restless 0 2 3  Easily annoyed or irritable 0 2 1  Afraid - awful might happen 1 3 0  Total GAD 7 Score 2 17 13   Anxiety Difficulty Not difficult at all  Not difficult at all    Medications: Outpatient Medications Prior to Visit  Medication Sig   ciclopirox  (PENLAC ) 8 % solution Apply topically at bedtime. Apply over nail and surrounding skin. Apply daily over previous coat. After seven (7) days, may remove with alcohol and continue cycle.   diazepam (VALIUM) 5 MG tablet Take 5 mg by mouth every 12 (twelve) hours as needed.   gabapentin  (NEURONTIN ) 300 MG capsule Take 300 mg by mouth 2 (two) times daily.   mirtazapine  (REMERON ) 7.5 MG tablet TAKE 1 TABLET BY MOUTH AT BEDTIME.   naloxone (NARCAN) nasal spray 4 mg/0.1 mL Place 1 spray into the nose once.   SUBLOCADE 300 MG/1.5ML SOSY  terbinafine  (LAMISIL ) 250 MG tablet Take 1 tablet (250 mg total) by mouth daily.   busPIRone  (BUSPAR ) 5 MG tablet Take 5 mg by mouth 2 (two) times daily. Take one tablet by mouth 2x daily (Patient not taking: Reported on 03/23/2024)   meloxicam  (MOBIC ) 7.5 MG tablet Take 1 tablet (7.5 mg total) by mouth daily. (Patient not taking: Reported on 03/23/2024)   No facility-administered medications prior to visit.    Review of Systems All negative Except see HPI       Objective    BP 120/78 (BP Location: Left Arm, Patient Position: Sitting)   Pulse 89   Resp 16   Ht 6\' 4"  (1.93 m)   Wt 165 lb 6.4 oz (75 kg)   SpO2 100%   BMI 20.13 kg/m     Physical Exam   No results found for any visits on 03/23/24.      Assessment & Plan Vaping Continues vaping. Discussed risks, including lung damage from hot vapor. Encouraged  cessation and provided resources. - Visit 'Quit Smoking' website for resources and support. - Consider free nicotine  replacement therapy options.  Depression/Weight gain/Underweight    02/20/2024   10:05 AM 05/31/2023   10:22 AM 03/01/2023   11:14 AM  PHQ9 SCORE ONLY  PHQ-9 Total Score 2 0 0  chronic Improving with mirtazapine . Weight increased. Discussed dosage increase but maintaining current dose due to effectiveness. Vivid dreams improving. - Refill mirtazapine  for one month. - Discuss dosage and side effects with behavioral health provider. - Monitor weight and dietary intake. Will follow-up  Anxiety Improving with mirtazapine . Considered dosage adjustment for sedation but maintaining current dose due to effectiveness. - Refill mirtazapine  for one month. - Discuss dosage and side effects with behavioral health provider.  Hepatitis C Medication monitoring Chronic condition. Liver function checked before Lamisil , safe to proceed. No acute issues. - Complete lab work as assigned by the podiatrist.  Toenail removal/Onychmycosis Toenails removed, healing well. On Lamisil  for four weeks. Liver function checked before starting Lamisil , safe to proceed. - Continue Lamisil  as prescribed for four weeks. - Follow up with podiatrist as needed. - Complete lab work as assigned by the podiatrist.  Underweight (BMI < 18.5) Improved Continue for extra mo - mirtazapine  (REMERON ) 7.5 MG tablet; Take 1 tablet (7.5 mg total) by mouth at bedtime.  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 Will follow-up      No orders of the defined types were placed in this encounter.   No follow-ups on file.   The  patient was advised to call back or seek an in-person evaluation if the symptoms worsen or if the condition fails to improve as anticipated.  I discussed the assessment and treatment plan with the patient. The patient was provided an opportunity to ask questions and all were answered. The patient agreed with the plan and demonstrated an understanding of the instructions.  I, Gurjot Brisco, PA-C have reviewed all documentation for this visit. The documentation on 03/23/2024  for the exam, diagnosis, procedures, and orders are all accurate and complete.  Blane Bunting, [facility], MMS [facility] [phone] (phone) [phone] (fax)  [facility]

## 2024-03-26 DIAGNOSIS — F339 Major depressive disorder, recurrent, unspecified: Secondary | ICD-10-CM | POA: Insufficient documentation

## 2024-03-26 DIAGNOSIS — B351 Tinea unguium: Secondary | ICD-10-CM | POA: Insufficient documentation

## 2024-03-26 DIAGNOSIS — R636 Underweight: Secondary | ICD-10-CM | POA: Insufficient documentation

## 2024-03-29 ENCOUNTER — Other Ambulatory Visit (HOSPITAL_BASED_OUTPATIENT_CLINIC_OR_DEPARTMENT_OTHER): Payer: Self-pay

## 2024-04-19 ENCOUNTER — Ambulatory Visit: Payer: MEDICAID | Admitting: Physician Assistant

## 2024-04-20 ENCOUNTER — Ambulatory Visit: Payer: MEDICAID | Admitting: Physician Assistant

## 2024-06-07 ENCOUNTER — Other Ambulatory Visit: Payer: Self-pay | Admitting: Podiatry

## 2024-06-20 ENCOUNTER — Ambulatory Visit (INDEPENDENT_AMBULATORY_CARE_PROVIDER_SITE_OTHER): Payer: MEDICAID | Admitting: Physician Assistant

## 2024-06-20 ENCOUNTER — Encounter: Payer: Self-pay | Admitting: Physician Assistant

## 2024-06-20 VITALS — BP 113/73 | HR 71 | Temp 98.4°F | Ht 76.0 in | Wt 178.2 lb

## 2024-06-20 DIAGNOSIS — N289 Disorder of kidney and ureter, unspecified: Secondary | ICD-10-CM

## 2024-06-20 DIAGNOSIS — A630 Anogenital (venereal) warts: Secondary | ICD-10-CM

## 2024-06-20 DIAGNOSIS — N489 Disorder of penis, unspecified: Secondary | ICD-10-CM | POA: Diagnosis not present

## 2024-06-20 MED ORDER — PODOFILOX 0.5 % EX SOLN
CUTANEOUS | 0 refills | Status: AC
Start: 2024-06-20 — End: ?

## 2024-06-20 NOTE — Progress Notes (Signed)
 " Established patient visit  Patient: Bradley Bush   DOB: 1986-06-05   38 y.o. Male  MRN: [id number] Visit Date: 06/20/2024  Today's healthcare provider: Jolynn Spencer, PA-C   Chief Complaint  Patient presents with   Cyst    Patient has bump located mid chest around sternum. Reports this has been there for a year, is discolored and seems abnormal.    Subjective      Discussed the use of AI scribe software for clinical note transcription with the patient, who gave verbal consent to proceed.  History of Present Illness Bradley Bush is a 38 year old male who presents with a suspected HPV outbreak.  He has a one centimeter bump on his genital area, suspected to be an HPV outbreak. The bump is not painful and does not cause discomfort during intercourse. He discovered it while using the bathroom and showering. He recalls a similar outbreak after leaving a drug rehabilitation program and received a topical medication in small packets, though he does not remember the medication's name or treatment duration. He has not received the HPV vaccine. He is open to the hepatitis B vaccine but opts not to receive COVID, flu, or pneumonia vaccinations at this time. He denies having had COVID-19.       02/20/2024   10:05 AM 05/31/2023   10:22 AM 03/01/2023   11:14 AM  Depression screen PHQ 2/9  Decreased Interest 0 0 0  Down, Depressed, Hopeless 0 0 0  PHQ - 2 Score 0 0 0  Altered sleeping 0  0  Tired, decreased energy 0  0  Change in appetite 1  0  Feeling bad or failure about yourself  1  0  Trouble concentrating 0  0  Moving slowly or fidgety/restless 0  0  Suicidal thoughts 0  0  PHQ-9 Score 2  0  Difficult doing work/chores Not difficult at all  Not difficult at all      02/20/2024   10:06 AM 02/01/2023   10:44 AM 11/24/2022    1:57 PM  GAD 7 : Generalized Anxiety Score  Nervous, Anxious, on Edge 0 3 3  Control/stop worrying 1 3 3   Worry too much - different things 0 2 3  Trouble relaxing  0 2 0  Restless 0 2 3  Easily annoyed or irritable 0 2 1  Afraid - awful might happen 1 3 0  Total GAD 7 Score 2 17 13   Anxiety Difficulty Not difficult at all  Not difficult at all    Medications: Outpatient Medications Prior to Visit  Medication Sig   ADDERALL 10 MG tablet Take 10 mg by mouth daily as needed.   busPIRone  (BUSPAR ) 5 MG tablet Take 5 mg by mouth 2 (two) times daily.   ciclopirox  (PENLAC ) 8 % solution Apply topically at bedtime. Apply over nail and surrounding skin. Apply daily over previous coat. After seven (7) days, may remove with alcohol and continue cycle.   diazepam (VALIUM) 5 MG tablet Take 5 mg by mouth every 12 (twelve) hours as needed.   gabapentin  (NEURONTIN ) 300 MG capsule Take 300 mg by mouth 2 (two) times daily.   mirtazapine  (REMERON ) 7.5 MG tablet Take 1 tablet (7.5 mg total) by mouth at bedtime.   naloxone (NARCAN) nasal spray 4 mg/0.1 mL Place 1 spray into the nose once.   SUBLOCADE 300 MG/1.5ML SOSY    VYVANSE 40 MG capsule Take 40 mg by mouth every morning.   No facility-administered medications  prior to visit.    Review of Systems All negative Except see HPI       Objective    BP 113/73 (BP Location: Left Arm, Patient Position: Sitting, Cuff Size: Normal)   Pulse 71   Temp 98.4 F (36.9 C) (Oral)   Ht 6' 4 (1.93 m)   Wt 178 lb 3.2 oz (80.8 kg)   SpO2 100%   BMI 21.69 kg/m     Physical Exam Vitals reviewed.  Constitutional:      General: He is not in acute distress.    Appearance: Normal appearance. He is not diaphoretic.  HENT:     Head: Normocephalic and atraumatic.  Eyes:     General: No scleral icterus.    Conjunctiva/sclera: Conjunctivae normal.  Cardiovascular:     Rate and Rhythm: Normal rate and regular rhythm.     Pulses: Normal pulses.     Heart sounds: Normal heart sounds. No murmur heard. Pulmonary:     Effort: Pulmonary effort is normal. No respiratory distress.     Breath sounds: Normal breath sounds. No  wheezing or rhonchi.  Musculoskeletal:     Cervical back: Neck supple.     Right lower leg: No edema.     Left lower leg: No edema.  Lymphadenopathy:     Cervical: No cervical adenopathy.  Skin:    General: Skin is warm and dry.     Findings: Lesion present. No rash.  Neurological:     Mental Status: He is alert and oriented to person, place, and time. Mental status is at baseline.  Psychiatric:        Mood and Affect: Mood normal.        Behavior: Behavior normal.      No results found for any visits on 06/20/24.      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 No prior HPV vaccination. - Prescribe topical medication twice daily for three days, then four days off. Repeat up to four cycles. - Refer to dermatologist or urologist if ineffective after four cycles. - Administer first HPV vaccine dose today or next visit, total of three doses required.  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   Return for nurse visit hpv 1st shot.   The patient was advised to call back or seek an in-person evaluation if the symptoms worsen or if the condition fails to improve as anticipated.  I discussed the assessment and treatment plan with the patient. The patient was provided an opportunity to ask questions and all were answered. The patient agreed with the plan and demonstrated an understanding of the instructions.  I, Tobie Perdue, PA-C have reviewed all documentation for this visit. The documentation on 06/20/2024  for the exam, diagnosis, procedures, and orders are all accurate and complete.  Jolynn Spencer, [facility], MMS [facility] [phone] (phone) [phone] (fax)  [facility] "

## 2024-06-21 ENCOUNTER — Telehealth: Payer: Self-pay | Admitting: Physician Assistant

## 2024-06-21 NOTE — Telephone Encounter (Signed)
 Copied from CRM [id number]. Topic: Clinical - Medication Prior Auth >> Jun 21, 2024  4:04 PM Jasmin G wrote: Reason for CRM: Pt states that his Insurance, [facility] needs prior authorization to refill podofilox  (CONDYLOX ) 0.5 % external solution, call him back if needed at [phone].

## 2024-06-22 ENCOUNTER — Telehealth: Payer: Self-pay

## 2024-06-22 ENCOUNTER — Other Ambulatory Visit (HOSPITAL_COMMUNITY): Payer: Self-pay

## 2024-06-22 DIAGNOSIS — N489 Disorder of penis, unspecified: Secondary | ICD-10-CM

## 2024-06-22 NOTE — Telephone Encounter (Signed)
 Pharmacy Patient Advocate Encounter   Received notification from Pt Calls Messages that prior authorization for Podofilox  0.5% solution  is required/requested.   Insurance verification completed.   The patient is insured through [employer] [place] [employer] .   Per test claim: PA required; PA submitted to above mentioned insurance via Latent Key/confirmation #/EOC [id number] Status is pending

## 2024-06-26 ENCOUNTER — Telehealth: Payer: Self-pay

## 2024-06-26 ENCOUNTER — Other Ambulatory Visit (HOSPITAL_COMMUNITY): Payer: Self-pay

## 2024-06-26 MED ORDER — IMIQUIMOD 5 % EX CREA
TOPICAL_CREAM | CUTANEOUS | 0 refills | Status: AC
Start: 2024-06-28 — End: ?

## 2024-06-26 NOTE — Telephone Encounter (Signed)
 Received additional information for a prior auth for Podofilox  0.5% solution.   Per the request, Podofilox  is not a preferred drug for the plan. The insurance prefers Imiquimod  5% cream.  Per the test claim, the copay came back as $4.   Please advise to continue with the PA or if you would like to change.   Thanks

## 2024-06-26 NOTE — Telephone Encounter (Signed)
 error

## 2024-07-23 ENCOUNTER — Ambulatory Visit: Payer: MEDICAID | Admitting: Physician Assistant

## 2024-09-16 NOTE — Progress Notes (Signed)
 Complete physical exam  Patient: Bradley Bush   DOB: [birth year]   38 y.o. Male  MRN: [id number] Visit Date: 09/17/2024  Today's healthcare provider: Jolynn Spencer, PA-C   No chief complaint on file.  Subjective    Bradley Bush is a 38 y.o. male who presents today for a complete physical exam.  He reports consuming a {diet types:17450} diet. {Exercise:19826} He generally feels {well/fairly well/poorly:18703}. He reports sleeping {well/fairly well/poorly:18703}. He {does/does not:200015} have additional problems to discuss today.  HPI  *** Discussed the use of AI scribe software for clinical note transcription with the patient, who gave verbal consent to proceed.  History of Present Illness    Stable patients can be seen less frequently, potentially monthly.  Urine drug testing should be done regularly, with at least 8 tests per year for patients in opioid treatment programs.  Monitoring includes assessing participation in psychosocial treatments, occupational and social functioning, and abstinence from non-prescribed drugs.  Reviewing state prescription drug monitoring programs for other prescribed medications may be useful.  If stimulants are deemed necessary, extended-release formulations may be considered as they have a lower abuse liability compared to immediate-release forms.[5][7]  Close monitoring, including drug contracts, random urine drug screening, and prescribing the smallest appropriate quantity, is essential if stimulants are used   If stimulants are deemed necessary, extended-release formulations may be considered as they have a lower abuse liability compared to immediate-release forms.[5][7]  Close monitoring, including drug contracts, random urine drug screening, and prescribing the smallest appropriate quantity, is essential if stimulants are used  Last depression screening scores    02/20/2024   10:05 AM 05/31/2023   10:22 AM 03/01/2023   11:14 AM  PHQ 2/9 Scores   PHQ - 2 Score 0 0 0  PHQ- 9 Score 2   0      Data saved with a previous flowsheet row definition   Last fall risk screening    06/20/2024    2:53 PM  Fall Risk   Falls in the past year? 0  Number falls in past yr: 0  Injury with Fall? 0  Risk for fall due to : No Fall Risks  Follow up Falls evaluation completed   Last Audit-C alcohol use screening    02/17/2024    8:13 AM  Alcohol Use Disorder Test (AUDIT)  1. How often do you have a drink containing alcohol? 0  3. How often do you have six or more drinks on one occasion? 0   A score of 3 or more in women, and 4 or more in men indicates increased risk for alcohol abuse, EXCEPT if all of the points are from question 1   Past Medical History:  Diagnosis Date   Drug abuse (HCC)    Hepatitis C    Past Surgical History:  Procedure Laterality Date   INTRAMEDULLARY (IM) NAIL INTERTROCHANTERIC Left 12/01/2022   Procedure: INTRAMEDULLARY (IM) NAIL INTERTROCHANTERIC;  Surgeon: Edie Norleen PARAS, MD;  Location: [facility] ORS;  Service: Orthopedics;  Laterality: Left;   Social History   Socioeconomic History   Marital status: Single    Spouse name: Not on file   Number of children: Not on file   Years of education: Not on file   Highest education level: GED or equivalent  Occupational History   Not on file  Tobacco Use   Smoking status: Every Day    Current packs/day: 0.00    Types: E-cigarettes, Cigarettes    Last attempt to  quit: 08/25/2022    Years since quitting: 2.0   Smokeless tobacco: Never  Vaping Use   Vaping status: Every Day   Start date: 08/25/2022  Substance and Sexual Activity   Alcohol use: Not Currently   Drug use: Not Currently    Types: Cocaine, IV    Comment: history of   Sexual activity: Yes  Other Topics Concern   Not on file  Social History Narrative   Not on file   Social Drivers of Health   Financial Resource Strain: Medium Risk (06/20/2024)   Overall Financial Resource Strain (CARDIA)     Difficulty of Paying Living Expenses: Somewhat hard  Food Insecurity: Food Insecurity Present (06/20/2024)   Hunger Vital Sign    Worried About Running Out of Food in the Last Year: Never true    Ran Out of Food in the Last Year: Sometimes true  Transportation Needs: No Transportation Needs (02/17/2024)   PRAPARE - Administrator, Civil Service (Medical): No    Lack of Transportation (Non-Medical): No  Physical Activity: Sufficiently Active (06/20/2024)   Exercise Vital Sign    Days of Exercise per Week: 3 days    Minutes of Exercise per Session: 60 min  Stress: No Stress Concern Present (06/20/2024)   [employer] of Occupational Health - Occupational Stress Questionnaire    Feeling of Stress: Not at all  Social Connections: Moderately Isolated (06/20/2024)   Social Connection and Isolation Panel    Frequency of Communication with Friends and Family: Once a week    Frequency of Social Gatherings with Friends and Family: Twice a week    Attends Religious Services: Never    Database Administrator or Organizations: Yes    Attends Banker Meetings: 1 to 4 times per year    Marital Status: Never married  Intimate Partner Violence: Not At Risk (12/01/2022)   Humiliation, Afraid, Rape, and Kick questionnaire    Fear of Current or Ex-Partner: No    Emotionally Abused: No    Physically Abused: No    Sexually Abused: No   Family Status  Relation Name Status   Mother  Alive   Father  Alive  No partnership data on file   Family History  Problem Relation Age of Onset   Diabetes Mother    No Known Allergies  Patient Care Team: Soul Hackman, PA-C as PCP - General (Physician Assistant)   Medications: Outpatient Medications Prior to Visit  Medication Sig   ADDERALL 10 MG tablet Take 10 mg by mouth daily as needed.   busPIRone  (BUSPAR ) 5 MG tablet Take 5 mg by mouth 2 (two) times daily.   ciclopirox  (PENLAC ) 8 % solution Apply topically at bedtime. Apply over  nail and surrounding skin. Apply daily over previous coat. After seven (7) days, may remove with alcohol and continue cycle.   diazepam (VALIUM) 5 MG tablet Take 5 mg by mouth every 12 (twelve) hours as needed.   gabapentin  (NEURONTIN ) 300 MG capsule Take 300 mg by mouth 2 (two) times daily.   imiquimod  (ALDARA ) 5 % cream Apply TOPICALLY to defined treatment area 2 times per week at bedtime for 16 weeks; leave on skin for 8 hours   mirtazapine  (REMERON ) 7.5 MG tablet Take 1 tablet (7.5 mg total) by mouth at bedtime.   naloxone (NARCAN) nasal spray 4 mg/0.1 mL Place 1 spray into the nose once.   podofilox  (CONDYLOX ) 0.5 % external solution Apply topically every 12 hours in  the morning and evening for 3 days, then withhold for 4 days; repeat cycle up to 4 times   SUBLOCADE 300 MG/1.5ML SOSY    VYVANSE 40 MG capsule Take 40 mg by mouth every morning.   No facility-administered medications prior to visit.    Review of Systems Except see HPI  {Insert previous labs (optional):23779} {See past labs  Heme  Chem  Endocrine  Serology  Results Review (optional):1}  Objective    There were no vitals taken for this visit. {Insert last BP/Wt (optional):23777}{See vitals history (optional):1}    Physical Exam   No results found for any visits on 09/17/24.  Assessment & Plan    Routine Health Maintenance and Physical Exam  Exercise Activities and Dietary recommendations  Goals   None     Immunization History  Administered Date(s) Administered   Hep A / Hep B 05/15/2021   Moderna Sars-Covid-2 Vaccination 03/08/2020, 04/11/2020   Tdap 02/01/2023    Health Maintenance  Topic Date Due   HPV Vaccine (1 - 3-dose SCDM series) Never done   Hepatitis B Vaccine (2 of 3 - 19+ 3-dose series) 06/12/2021   COVID-19 Vaccine (3 - 2025-26 season) 06/25/2024   Flu Shot  01/22/2025*   Pneumococcal Vaccine (1 of 2 - PCV) 06/20/2025*   DTaP/Tdap/Td vaccine (2 - Td or Tdap) 01/31/2033   Hepatitis  C Screening  Completed   HIV Screening  Completed   Meningitis B Vaccine  Aged Out  *Topic was postponed. The date shown is not the original due date.    Discussed health benefits of physical activity, and encouraged him to engage in regular exercise appropriate for his age and condition.  Assessment and Plan Assessment & Plan      ***  No follow-ups on file.    The patient was advised to call back or seek an in-person evaluation if the symptoms worsen or if the condition fails to improve as anticipated.  I discussed the assessment and treatment plan with the patient. The patient was provided an opportunity to ask questions and all were answered. The patient agreed with the plan and demonstrated an understanding of the instructions.  I, Samanta Gal, PA-C have reviewed all documentation for this visit. The documentation on 09/17/2024  for the exam, diagnosis, procedures, and orders are all accurate and complete.  Jolynn Spencer, [facility], MMS [facility] [phone] (phone) [phone] (fax)  [facility]

## 2024-09-17 ENCOUNTER — Encounter: Payer: Self-pay | Admitting: Physician Assistant

## 2024-09-17 ENCOUNTER — Ambulatory Visit (INDEPENDENT_AMBULATORY_CARE_PROVIDER_SITE_OTHER): Payer: MEDICAID | Admitting: Physician Assistant

## 2024-09-17 VITALS — BP 119/81 | HR 84 | Resp 16 | Ht 76.0 in | Wt 170.7 lb

## 2024-09-17 DIAGNOSIS — Z0000 Encounter for general adult medical examination without abnormal findings: Secondary | ICD-10-CM

## 2024-09-17 DIAGNOSIS — Z0184 Encounter for antibody response examination: Secondary | ICD-10-CM

## 2024-09-17 DIAGNOSIS — B351 Tinea unguium: Secondary | ICD-10-CM

## 2024-09-17 DIAGNOSIS — F339 Major depressive disorder, recurrent, unspecified: Secondary | ICD-10-CM

## 2024-09-17 DIAGNOSIS — Z681 Body mass index (BMI) 19 or less, adult: Secondary | ICD-10-CM

## 2024-09-17 DIAGNOSIS — R748 Abnormal levels of other serum enzymes: Secondary | ICD-10-CM

## 2024-09-17 DIAGNOSIS — F17290 Nicotine dependence, other tobacco product, uncomplicated: Secondary | ICD-10-CM

## 2024-09-17 DIAGNOSIS — F1920 Other psychoactive substance dependence, uncomplicated: Secondary | ICD-10-CM

## 2024-09-17 DIAGNOSIS — Z7185 Encounter for immunization safety counseling: Secondary | ICD-10-CM

## 2024-09-17 DIAGNOSIS — A630 Anogenital (venereal) warts: Secondary | ICD-10-CM

## 2024-09-17 DIAGNOSIS — F419 Anxiety disorder, unspecified: Secondary | ICD-10-CM

## 2024-09-17 DIAGNOSIS — F17200 Nicotine dependence, unspecified, uncomplicated: Secondary | ICD-10-CM

## 2024-09-17 DIAGNOSIS — Z5181 Encounter for therapeutic drug level monitoring: Secondary | ICD-10-CM

## 2024-09-18 DIAGNOSIS — F17200 Nicotine dependence, unspecified, uncomplicated: Secondary | ICD-10-CM | POA: Insufficient documentation

## 2024-09-18 DIAGNOSIS — R748 Abnormal levels of other serum enzymes: Secondary | ICD-10-CM | POA: Insufficient documentation

## 2024-09-18 DIAGNOSIS — Z0000 Encounter for general adult medical examination without abnormal findings: Secondary | ICD-10-CM | POA: Insufficient documentation

## 2024-10-13 LAB — COMPREHENSIVE METABOLIC PANEL WITH GFR
ALT: 11 IU/L (ref 0–44)
AST: 20 IU/L (ref 0–40)
Albumin: 4.5 g/dL (ref 4.1–5.1)
Alkaline Phosphatase: 64 IU/L (ref 47–123)
BUN/Creatinine Ratio: 22 — ABNORMAL HIGH (ref 9–20)
BUN: 18 mg/dL (ref 6–24)
Bilirubin Total: 0.3 mg/dL (ref 0.0–1.2)
CO2: 24 mmol/L (ref 20–29)
Calcium: 9.5 mg/dL (ref 8.7–10.2)
Chloride: 104 mmol/L (ref 96–106)
Creatinine, Ser: 0.81 mg/dL (ref 0.76–1.27)
Globulin, Total: 2.3 g/dL (ref 1.5–4.5)
Glucose: 76 mg/dL (ref 70–99)
Potassium: 4.2 mmol/L (ref 3.5–5.2)
Sodium: 143 mmol/L (ref 134–144)
Total Protein: 6.8 g/dL (ref 6.0–8.5)
eGFR: 114 mL/min/1.73

## 2024-10-13 LAB — HEPATITIS B SURFACE ANTIBODY, QUANTITATIVE: Hepatitis B Surf Ab Quant: 5.3 m[IU]/mL — ABNORMAL LOW

## 2024-10-15 ENCOUNTER — Ambulatory Visit: Payer: Self-pay | Admitting: Physician Assistant

## 2024-10-22 ENCOUNTER — Ambulatory Visit (INDEPENDENT_AMBULATORY_CARE_PROVIDER_SITE_OTHER): Payer: MEDICAID | Admitting: Family Medicine

## 2024-10-22 ENCOUNTER — Encounter: Payer: Self-pay | Admitting: Family Medicine

## 2024-10-22 VITALS — BP 98/61 | HR 94 | Resp 16 | Ht 77.0 in | Wt 170.0 lb

## 2024-10-22 DIAGNOSIS — Z23 Encounter for immunization: Secondary | ICD-10-CM | POA: Diagnosis not present

## 2024-10-22 NOTE — Progress Notes (Signed)
Vaccine only, no E&M visit

## 2025-03-25 ENCOUNTER — Ambulatory Visit: Payer: MEDICAID | Admitting: Physician Assistant

## 2025-09-18 ENCOUNTER — Encounter: Payer: MEDICAID | Admitting: Physician Assistant
